# Patient Record
Sex: Female | Born: 1935 | Race: Black or African American | Hispanic: No | Smoking: Former smoker
Health system: Southern US, Community
[De-identification: ages and names within clinical notes are randomized; demographics above are authoritative.]

## PROBLEM LIST (undated history)

## (undated) DIAGNOSIS — I10 Essential (primary) hypertension: Secondary | ICD-10-CM

## (undated) DIAGNOSIS — S065X9A Traumatic subdural hemorrhage with loss of consciousness of unspecified duration, initial encounter: Secondary | ICD-10-CM

## (undated) DIAGNOSIS — K219 Gastro-esophageal reflux disease without esophagitis: Secondary | ICD-10-CM

## (undated) DIAGNOSIS — S065XAA Traumatic subdural hemorrhage with loss of consciousness status unknown, initial encounter: Secondary | ICD-10-CM

## (undated) DIAGNOSIS — M1990 Unspecified osteoarthritis, unspecified site: Secondary | ICD-10-CM

## (undated) DIAGNOSIS — E785 Hyperlipidemia, unspecified: Secondary | ICD-10-CM

## (undated) DIAGNOSIS — E039 Hypothyroidism, unspecified: Secondary | ICD-10-CM

## (undated) DIAGNOSIS — C801 Malignant (primary) neoplasm, unspecified: Secondary | ICD-10-CM

## (undated) HISTORY — PX: BREAST SURGERY: SHX581

## (undated) HISTORY — PX: COLONOSCOPY: SHX174

## (undated) HISTORY — PX: TOTAL THYROIDECTOMY: SHX2547

## (undated) HISTORY — PX: TOOTH EXTRACTION: SUR596

---

## 2003-03-29 ENCOUNTER — Ambulatory Visit (HOSPITAL_COMMUNITY): Admission: RE | Admit: 2003-03-29 | Discharge: 2003-03-29 | Payer: Self-pay | Admitting: Gastroenterology

## 2013-06-14 ENCOUNTER — Other Ambulatory Visit: Payer: Self-pay | Admitting: Gastroenterology

## 2013-06-14 DIAGNOSIS — R109 Unspecified abdominal pain: Secondary | ICD-10-CM

## 2013-06-21 ENCOUNTER — Inpatient Hospital Stay
Admission: RE | Admit: 2013-06-21 | Discharge: 2013-06-21 | Disposition: A | Discharge status: Home / Self Care | Payer: Self-pay | Source: Ambulatory Visit | Attending: Gastroenterology | Admitting: Gastroenterology

## 2013-09-13 ENCOUNTER — Other Ambulatory Visit: Payer: Self-pay | Admitting: Obstetrics and Gynecology

## 2013-09-13 ENCOUNTER — Encounter (HOSPITAL_COMMUNITY): Payer: Self-pay | Admitting: Pharmacist

## 2013-09-13 NOTE — Patient Instructions (Addendum)
   Your procedure is scheduled on:  Wednesday, Feb 4  Enter through the [employer] [facility] at: 11 AM Pick up the phone at the desk and dial [phone] and inform us of your arrival.  Please call this number if you have any problems the morning of surgery: [phone]  Remember: Do not eat food after midnight: Tuesday Do not drink clear liquids after: 8 am Wednesday, day of surgery Take these medicines the morning of surgery with a SIP OF WATER:  Aciphex, hctz, synthroid, lisinopril   Do not wear jewelry, make-up, or FINGER nail polish No metal in your hair or on your body. Do not wear lotions, powders, perfumes. You may wear deodorant.  Do not bring valuables to the hospital. Contacts, dentures or bridgework may not be worn into surgery.  Patients discharged on the day of surgery will not be allowed to drive home.  Home with daughter Rebecca Travis cell [phone].

## 2013-09-14 ENCOUNTER — Encounter (HOSPITAL_COMMUNITY): Payer: Self-pay

## 2013-09-14 ENCOUNTER — Encounter (HOSPITAL_COMMUNITY)
Admission: RE | Admit: 2013-09-14 | Discharge: 2013-09-14 | Disposition: A | Discharge status: Home / Self Care | Payer: Medicare Other | Source: Ambulatory Visit | Attending: Obstetrics and Gynecology | Admitting: Obstetrics and Gynecology

## 2013-09-14 HISTORY — DX: Hyperlipidemia, unspecified: E78.5

## 2013-09-14 HISTORY — DX: Unspecified osteoarthritis, unspecified site: M19.90

## 2013-09-14 HISTORY — DX: Hypothyroidism, unspecified: E03.9

## 2013-09-14 HISTORY — DX: Malignant (primary) neoplasm, unspecified: C80.1

## 2013-09-14 HISTORY — DX: Essential (primary) hypertension: I10

## 2013-09-14 HISTORY — DX: Gastro-esophageal reflux disease without esophagitis: K21.9

## 2013-09-14 LAB — CBC
HCT: 38.5 % (ref 36.0–46.0)
Hemoglobin: 12.9 g/dL (ref 12.0–15.0)
MCH: 29.2 pg (ref 26.0–34.0)
MCHC: 33.5 g/dL (ref 30.0–36.0)
MCV: 87.1 fL (ref 78.0–100.0)
Platelets: 252 10*3/uL (ref 150–400)
RBC: 4.42 MIL/uL (ref 3.87–5.11)
RDW: 13.8 % (ref 11.5–15.5)
WBC: 6.2 10*3/uL (ref 4.0–10.5)

## 2013-09-14 LAB — BASIC METABOLIC PANEL
BUN: 16 mg/dL (ref 6–23)
CALCIUM: 9.6 mg/dL (ref 8.4–10.5)
CO2: 31 mEq/L (ref 19–32)
Chloride: 103 mEq/L (ref 96–112)
Creatinine, Ser: 0.81 mg/dL (ref 0.50–1.10)
GFR calc Af Amer: 74 mL/min — ABNORMAL LOW (ref >90)
GFR, EST NON AFRICAN AMERICAN: 64 mL/min — AB (ref >90)
GLUCOSE: 90 mg/dL (ref 70–99)
POTASSIUM: 4.1 meq/L (ref 3.7–5.3)
Sodium: 144 mEq/L (ref 137–147)

## 2013-09-15 ENCOUNTER — Ambulatory Visit (HOSPITAL_COMMUNITY): Payer: Medicare Other | Admitting: Anesthesiology

## 2013-09-15 ENCOUNTER — Encounter (HOSPITAL_COMMUNITY)
Admission: RE | Disposition: A | Discharge status: Home / Self Care | Payer: Self-pay | Source: Ambulatory Visit | Attending: Obstetrics and Gynecology

## 2013-09-15 ENCOUNTER — Encounter (HOSPITAL_COMMUNITY): Payer: Self-pay | Admitting: Obstetrics and Gynecology

## 2013-09-15 ENCOUNTER — Encounter (HOSPITAL_COMMUNITY): Payer: Medicare Other | Admitting: Anesthesiology

## 2013-09-15 ENCOUNTER — Ambulatory Visit (HOSPITAL_COMMUNITY)
Admission: RE | Admit: 2013-09-15 | Discharge: 2013-09-15 | Disposition: A | Discharge status: Home / Self Care | Payer: Medicare Other | Source: Ambulatory Visit | Attending: Obstetrics and Gynecology | Admitting: Obstetrics and Gynecology

## 2013-09-15 DIAGNOSIS — Z87891 Personal history of nicotine dependence: Secondary | ICD-10-CM | POA: Insufficient documentation

## 2013-09-15 DIAGNOSIS — I10 Essential (primary) hypertension: Secondary | ICD-10-CM | POA: Insufficient documentation

## 2013-09-15 DIAGNOSIS — K219 Gastro-esophageal reflux disease without esophagitis: Secondary | ICD-10-CM | POA: Insufficient documentation

## 2013-09-15 DIAGNOSIS — N840 Polyp of corpus uteri: Secondary | ICD-10-CM | POA: Insufficient documentation

## 2013-09-15 DIAGNOSIS — N9489 Other specified conditions associated with female genital organs and menstrual cycle: Secondary | ICD-10-CM | POA: Diagnosis present

## 2013-09-15 DIAGNOSIS — E039 Hypothyroidism, unspecified: Secondary | ICD-10-CM | POA: Insufficient documentation

## 2013-09-15 HISTORY — PX: HYSTEROSCOPY W/D&C: SHX1775

## 2013-09-15 SURGERY — DILATATION AND CURETTAGE /HYSTEROSCOPY
Anesthesia: General | Site: Vagina

## 2013-09-15 MED ORDER — SUCCINYLCHOLINE CHLORIDE 20 MG/ML IJ SOLN
INTRAMUSCULAR | Status: DC | PRN
  Administered 2013-09-15: 100 mg via INTRAVENOUS

## 2013-09-15 MED ORDER — LIDOCAINE HCL (CARDIAC) 20 MG/ML IV SOLN
INTRAVENOUS | Status: AC
  Filled 2013-09-15: qty 5

## 2013-09-15 MED ORDER — LIDOCAINE HCL 2 % IJ SOLN
INTRAMUSCULAR | Status: DC | PRN
  Administered 2013-09-15: 10 mL

## 2013-09-15 MED ORDER — KETOROLAC TROMETHAMINE 30 MG/ML IJ SOLN: 15.0000 mg | Freq: Once | INTRAMUSCULAR | Status: DC | PRN

## 2013-09-15 MED ORDER — LIDOCAINE HCL (CARDIAC) 20 MG/ML IV SOLN
INTRAVENOUS | Status: DC | PRN
  Administered 2013-09-15: 40 mg via INTRAVENOUS

## 2013-09-15 MED ORDER — DEXAMETHASONE SODIUM PHOSPHATE 10 MG/ML IJ SOLN
INTRAMUSCULAR | Status: AC
  Filled 2013-09-15: qty 1

## 2013-09-15 MED ORDER — GLYCOPYRROLATE 0.2 MG/ML IJ SOLN
INTRAMUSCULAR | Status: AC
  Filled 2013-09-15: qty 1

## 2013-09-15 MED ORDER — PHENYLEPHRINE 40 MCG/ML (10ML) SYRINGE FOR IV PUSH (FOR BLOOD PRESSURE SUPPORT)
PREFILLED_SYRINGE | INTRAVENOUS | Status: AC
  Filled 2013-09-15: qty 5

## 2013-09-15 MED ORDER — MIDAZOLAM HCL 2 MG/2ML IJ SOLN: 0.5000 mg | Freq: Once | INTRAMUSCULAR | Status: DC | PRN

## 2013-09-15 MED ORDER — GLYCINE 1.5 % IR SOLN
Status: DC | PRN
  Administered 2013-09-15: 3000 mL

## 2013-09-15 MED ORDER — FENTANYL CITRATE 0.05 MG/ML IJ SOLN: 25.0000 ug | INTRAMUSCULAR | Status: DC | PRN

## 2013-09-15 MED ORDER — LIDOCAINE HCL 2 % IJ SOLN
INTRAMUSCULAR | Status: AC
  Filled 2013-09-15: qty 20

## 2013-09-15 MED ORDER — MEPERIDINE HCL 25 MG/ML IJ SOLN: 6.2500 mg | INTRAMUSCULAR | Status: DC | PRN

## 2013-09-15 MED ORDER — KETOROLAC TROMETHAMINE 30 MG/ML IJ SOLN
INTRAMUSCULAR | Status: DC | PRN
  Administered 2013-09-15: 15 mg via INTRAVENOUS

## 2013-09-15 MED ORDER — DEXAMETHASONE SODIUM PHOSPHATE 10 MG/ML IJ SOLN
INTRAMUSCULAR | Status: DC | PRN
  Administered 2013-09-15: 4 mg via INTRAVENOUS

## 2013-09-15 MED ORDER — PROMETHAZINE HCL 25 MG/ML IJ SOLN: 6.2500 mg | INTRAMUSCULAR | Status: DC | PRN

## 2013-09-15 MED ORDER — FENTANYL CITRATE 0.05 MG/ML IJ SOLN
INTRAMUSCULAR | Status: AC
  Filled 2013-09-15: qty 5

## 2013-09-15 MED ORDER — PROPOFOL 10 MG/ML IV BOLUS
INTRAVENOUS | Status: DC | PRN
  Administered 2013-09-15: 120 mg via INTRAVENOUS
  Administered 2013-09-15: 60 mg via INTRAVENOUS
  Administered 2013-09-15: 40 mg via INTRAVENOUS

## 2013-09-15 MED ORDER — LACTATED RINGERS IV SOLN: INTRAVENOUS | Status: DC | PRN

## 2013-09-15 MED ORDER — ONDANSETRON HCL 4 MG/2ML IJ SOLN
INTRAMUSCULAR | Status: DC | PRN
  Administered 2013-09-15: 4 mg via INTRAVENOUS

## 2013-09-15 MED ORDER — KETOROLAC TROMETHAMINE 30 MG/ML IJ SOLN
INTRAMUSCULAR | Status: AC
  Filled 2013-09-15: qty 1

## 2013-09-15 MED ORDER — ONDANSETRON HCL 4 MG/2ML IJ SOLN
INTRAMUSCULAR | Status: AC
  Filled 2013-09-15: qty 2

## 2013-09-15 MED ORDER — EPHEDRINE 5 MG/ML INJ
INTRAVENOUS | Status: AC
  Filled 2013-09-15: qty 10

## 2013-09-15 MED ORDER — MIDAZOLAM HCL 2 MG/2ML IJ SOLN
INTRAMUSCULAR | Status: AC
  Filled 2013-09-15: qty 2

## 2013-09-15 MED ORDER — LACTATED RINGERS IV SOLN
INTRAVENOUS | Status: DC
  Administered 2013-09-15 (×2): via INTRAVENOUS

## 2013-09-15 MED ORDER — SUCCINYLCHOLINE CHLORIDE 20 MG/ML IJ SOLN: INTRAMUSCULAR | Status: DC | PRN

## 2013-09-15 MED ORDER — IBUPROFEN 600 MG PO TABS: 600.0000 mg | ORAL_TABLET | Freq: Four times a day (QID) | ORAL | Status: DC | PRN

## 2013-09-15 MED ORDER — PROPOFOL 10 MG/ML IV EMUL
INTRAVENOUS | Status: AC
  Filled 2013-09-15: qty 20

## 2013-09-15 MED ORDER — EPHEDRINE SULFATE 50 MG/ML IJ SOLN
INTRAMUSCULAR | Status: DC | PRN
  Administered 2013-09-15 (×2): 5 mg via INTRAVENOUS

## 2013-09-15 MED ORDER — FENTANYL CITRATE 0.05 MG/ML IJ SOLN
INTRAMUSCULAR | Status: DC | PRN
  Administered 2013-09-15 (×2): 50 ug via INTRAVENOUS

## 2013-09-15 SURGICAL SUPPLY — 26 items
BOOTIES KNEE HIGH SLOAN (MISCELLANEOUS) ×6 IMPLANT
CANISTER SUCT 3000ML (MISCELLANEOUS) ×3 IMPLANT
CATH ROBINSON RED A/P 16FR (CATHETERS) ×3 IMPLANT
CLOTH BEACON ORANGE TIMEOUT ST (SAFETY) ×3 IMPLANT
CONTAINER PREFILL 10% NBF 60ML (FORM) ×6 IMPLANT
CORD ACTIVE DISPOSABLE (ELECTRODE) ×2
CORD ELECTRO ACTIVE DISP (ELECTRODE) ×1 IMPLANT
DILATOR CANAL MILEX (MISCELLANEOUS) IMPLANT
DRSG TELFA 3X8 NADH (GAUZE/BANDAGES/DRESSINGS) ×3 IMPLANT
ELECT LOOP GYNE PRO 24FR (CUTTING LOOP) ×3
ELECT REM PT RETURN 9FT ADLT (ELECTROSURGICAL)
ELECT VAPORTRODE GRVD BAR (ELECTRODE) IMPLANT
ELECTRODE LOOP GYNE PRO 24FR (CUTTING LOOP) ×1 IMPLANT
ELECTRODE REM PT RTRN 9FT ADLT (ELECTROSURGICAL) IMPLANT
ELECTRODE ROLLER VERSAPOINT (ELECTRODE) IMPLANT
ELECTRODE RT ANGLE VERSAPOINT (CUTTING LOOP) IMPLANT
ELECTRODE TWIZZLE TIP (MISCELLANEOUS) IMPLANT
GLOVE SURG SS PI 6.5 STRL IVOR (GLOVE) ×6 IMPLANT
GOWN STRL REUS W/TWL LRG LVL3 (GOWN DISPOSABLE) ×6 IMPLANT
LOOP ANGLED CUTTING 22FR (CUTTING LOOP) IMPLANT
PACK HYSTEROSCOPY LF (CUSTOM PROCEDURE TRAY) ×3 IMPLANT
PAD OB MATERNITY 4.3X12.25 (PERSONAL CARE ITEMS) ×3 IMPLANT
SUT VIC AB 2-0 CT1 27 (SUTURE) ×2
SUT VIC AB 2-0 CT1 TAPERPNT 27 (SUTURE) ×1 IMPLANT
TOWEL OR 17X24 6PK STRL BLUE (TOWEL DISPOSABLE) ×6 IMPLANT
WATER STERILE IRR 1000ML POUR (IV SOLUTION) ×3 IMPLANT

## 2013-09-15 NOTE — Discharge Instructions (Signed)

## 2013-09-15 NOTE — Op Note (Signed)
09/15/2013  1:46 PM  PATIENT:  Rebecca Travis  80 y.o. female  PRE-OPERATIVE DIAGNOSIS:  Uterine Polyp  POST-OPERATIVE DIAGNOSIS:  Uterine Polyp  PROCEDURE:  Procedure(s): DILATATION AND CURETTAGE /HYSTEROSCOPY/resection of polyp  SURGEON:  Dacoda Spallone P, MD  ASSISTANTS: none  ANESTHESIA:   general  ESTIMATED BLOOD LOSS: * No blood loss amount entered *   BLOOD ADMINISTERED:none  COMPLICATIONS:none  FINDINGS: The uterus sounded to 7 cm. Single 3cm endometrial polyp.  The remainder of the endometrium was atrophic  FLUID DEFICIT: Less than 100 cc  LOCAL MEDICATIONS USED:  LIDOCAINE  and Amount: 10 ml  SPECIMEN:  Source of Specimen:  Endometrial polyp and endometrial curettings  DISPOSITION OF SPECIMEN:  PATHOLOGY  COUNTS:  YES  DESCRIPTION OF PROCEDURE:the patient was taken to the operating room after appropriate identification and placed on the operating table. After the attainment of adequate general anesthesia she was placed in the lithotomy position. The perineum and vagina were prepped with multiple layers of Betadine. The bladder was emptied with a an in and out catheter. The perineum was draped in sterile field. A gray speculum was placed in the vagina. The cervix was grasped with a single-tooth tenaculum. A paracervical block was achieved with a total of 10 cc of 2% Xylocaine and the 5 and 7:00 positions. The uterus was sounded to 7 cm.  The cervix was then dilated to accommodate the diagnostic hysteroscope. The hysteroscope was used to evaluate all quadrants of the uterus.  The aforementioned endometrial polyp was noted. The operative hysteroscope was then placed. After further dilation of the cervix. It was used to resect the endometrial polyp along the base and the polyp was removed in its entirety through the cervix. The endometrial cavity was then curetted in all 4 quadrants with almost no tissue obtained. All instruments were then removed from the vagina  and the patient was awakened from general anesthesia and taken to the recovery room in satisfactory condition having tolerated the procedure well sponge and instrument counts correct.  PLAN OF CARE: Patient will be discharged home after adequate. Postanesthesia care  PATIENT DISPOSITION:  PACU - hemodynamically stable.   Delay start of Pharmacological VTE agent (>24hrs) due to surgical blood loss or risk of bleeding:  SCD hose were used during the procedure. Early ambulation is anticipated.   Eldred Manges, MD 1:46 PM

## 2013-09-15 NOTE — Anesthesia Preprocedure Evaluation (Signed)
Anesthesia Evaluation  Patient identified by MRN, date of birth, ID band Patient awake    Reviewed: Allergy & Precautions, H&P , Patient's Chart, lab work & pertinent test results, reviewed documented beta blocker date and time   History of Anesthesia Complications Negative for: history of anesthetic complications  Airway Mallampati: II TM Distance: >3 FB Neck ROM: full    Dental   Pulmonary former smoker,  breath sounds clear to auscultation        Cardiovascular Exercise Tolerance: Good hypertension, Rhythm:regular Rate:Normal     Neuro/Psych negative psych ROS   GI/Hepatic GERD-  ,  Endo/Other  Hypothyroidism   Renal/GU      Musculoskeletal   Abdominal   Peds  Hematology   Anesthesia Other Findings   Reproductive/Obstetrics                           Anesthesia Physical Anesthesia Plan  ASA: II  Anesthesia Plan: General LMA   Post-op Pain Management:    Induction:   Airway Management Planned:   Additional Equipment:   Intra-op Plan:   Post-operative Plan:   Informed Consent: I have reviewed the patients History and Physical, chart, labs and discussed the procedure including the risks, benefits and alternatives for the proposed anesthesia with the patient or authorized representative who has indicated his/her understanding and acceptance.   Dental Advisory Given  Plan Discussed with: CRNA, Surgeon and Anesthesiologist  Anesthesia Plan Comments:         Anesthesia Quick Evaluation

## 2013-09-15 NOTE — Transfer of Care (Signed)
Immediate Anesthesia Transfer of Care Note  Patient: Rebecca Travis  Procedure(s) Performed: Procedure(s): DILATATION AND CURETTAGE /HYSTEROSCOPY WITH UTERINE POLYP RESECTION (N/A)  Patient Location: PACU  Anesthesia Type:General  Level of Consciousness: awake, alert , oriented and patient cooperative  Airway & Oxygen Therapy: Patient Spontanous Breathing and Patient connected to nasal cannula oxygen  Post-op Assessment: Report given to PACU RN and Post -op Vital signs reviewed and stable  Post vital signs: Reviewed and stable  Complications: No apparent anesthesia complications

## 2013-09-15 NOTE — H&P (Signed)
Rebecca Travis is an 80 y.o. female presents for evaluation and removal of an endometrial mass noted on sonohysterogram at [facility].  The patient was referred by her GI physician who was doing a workup of reflux symptoms and abdominal bloating.  A CT scan showed fluid in the endometrial cavity.  There were no other significant abdominal findings on that study.    Pertinent Gynecological History:  Previous GYN Procedures: none  Last mammogram: normal Date: 11/2012 Last pap: normal Date: 06/2013 OB History: G3, P3   Menstrual History:  Postmenopausal  Past Medical History  Diagnosis Date  . Hypertension   . Hypothyroidism   . GERD (gastroesophageal reflux disease)   . SVD (spontaneous vaginal delivery)     x 3  . Hyperlipidemia     diet controlled - no meds  . Arthritis     shoulder, knee  - otc med prn  . Cancer     skin lesions removed squamous cell in MD office    Past Surgical History  Procedure Laterality Date  . Total thyroidectomy    . Breast surgery      right breast duct   . Colonoscopy    . Tooth extraction      History reviewed. No pertinent family history.  Social History:  reports that she quit smoking about 62 years ago. Her smoking use included Cigarettes. She smoked 0.15 packs per day. She has never used smokeless tobacco. She reports that she drinks about 3.0 ounces of alcohol per week. She reports that she does not use illicit drugs.  Allergies: No Known Allergies  Prescriptions prior to admission  Medication Sig Dispense Refill  . cycloSPORINE (RESTASIS) 0.05 % ophthalmic emulsion Place 1 drop into both eyes 2 (two) times daily.      . hydrochlorothiazide (HYDRODIURIL) 25 MG tablet Take 25 mg by mouth daily.      . hyoscyamine (LEVSIN SL) 0.125 MG SL tablet Place 0.125 mg under the tongue 3 (three) times daily as needed (for uspet stomach).      Marland Kitchen levothyroxine (SYNTHROID, LEVOTHROID) 88 MCG tablet Take 88 mcg by mouth daily before breakfast.       . lisinopril (PRINIVIL,ZESTRIL) 20 MG tablet Take 20 mg by mouth daily.      . RABEprazole (ACIPHEX) 20 MG tablet Take 20 mg by mouth 2 (two) times daily with a meal.        Review of Systems  Constitutional: Negative.   HENT: Negative.   Eyes: Negative.   Respiratory: Negative.   Cardiovascular: Positive for palpitations.       Occassional palpitations with episodes of dizziness  Gastrointestinal: Positive for heartburn, diarrhea and constipation.       Has had some reflux sx which are now well controlled. Has had abdominal bloating  Genitourinary: Negative.   Musculoskeletal: Negative.   Skin:       Several basal cell lesions excised  Neurological: Positive for dizziness. Negative for speech change, focal weakness, seizures and loss of consciousness.       No further episodes in the last month  Endo/Heme/Allergies: Negative.     Blood pressure 148/75, pulse 63, temperature 98.1 F (36.7 C), temperature source Oral, resp. rate 20, SpO2 100.00%. Physical Exam  Constitutional: She is oriented to person, place, and time. She appears well-developed and well-nourished.  HENT:  Head: Normocephalic and atraumatic.  Eyes: EOM are normal.  Neck: Normal range of motion. Neck supple. No thyromegaly present.  Cardiovascular: Normal rate and  regular rhythm.   Respiratory: Effort normal and breath sounds normal.  GI: Soft. Bowel sounds are normal.  Genitourinary:  Pelvic exam: VULVA: normal appearing vulva with no masses, tenderness or lesions, VAGINA: atrophic, vaginal discharge - clear and minimal, CERVIX: normal appearing cervix without discharge or lesions, lesions absent, no discharge noted, cervical motion tenderness absent, UTERUS: uterus is normal size, shape, consistency and nontender, ADNEXA: normal adnexa in size, nontender and no masses, RECTAL: normal rectal, no masses, exam limited by body habitus.  Musculoskeletal: Normal range of motion.  Neurological: She is alert and  oriented to person, place, and time.  Skin: Skin is warm and dry.  Psychiatric: She has a normal mood and affect.    Results for orders placed during the hospital encounter of 09/14/13 (from the past 24 hour(s))  BASIC METABOLIC PANEL     Status: Abnormal   Collection Time    09/14/13  1:35 PM      Result Value Range   Sodium 144  137 - 147 mEq/L   Potassium 4.1  3.7 - 5.3 mEq/L   Chloride 103  96 - 112 mEq/L   CO2 31  19 - 32 mEq/L   Glucose, Bld 90  70 - 99 mg/dL   BUN 16  6 - 23 mg/dL   Creatinine, Ser 0.81  0.50 - 1.10 mg/dL   Calcium 9.6  8.4 - 10.5 mg/dL   GFR calc non Af Amer 64 (*) >90 mL/min   GFR calc Af Amer 74 (*) >90 mL/min  CBC     Status: None   Collection Time    09/14/13  1:55 PM      Result Value Range   WBC 6.2  4.0 - 10.5 K/uL   RBC 4.42  3.87 - 5.11 MIL/uL   Hemoglobin 12.9  12.0 - 15.0 g/dL   HCT 38.5  36.0 - 46.0 %   MCV 87.1  78.0 - 100.0 fL   MCH 29.2  26.0 - 34.0 pg   MCHC 33.5  30.0 - 36.0 g/dL   RDW 13.8  11.5 - 15.5 %   Platelets 252  150 - 400 K/uL   SONOHYSTEROGRAM: Normal sized uterus.  Ovaries not visualized.  No adnexal masses.  Endometrium almost entirely filled with mass that appears to originate form anterior wall measuring 2.7cm in greatest dimension.  Assessment/Plan: Asymptomatic endometrial mass in post menopausal pt. Hysteroscopy, resection of endometrial lesion and d&c recommended and accepted. risks of surgery reviewed as anesthesia, infection, bleeding and damage to adjacent organs, as well as the specfic risk of uterine perforation.   Jerick Khachatryan P 09/15/2013, 12:22 PM

## 2013-09-15 NOTE — Anesthesia Postprocedure Evaluation (Signed)
  Anesthesia Post Note  Patient: Rebecca Travis  Procedure(s) Performed: Procedure(s) (LRB): DILATATION AND CURETTAGE /HYSTEROSCOPY WITH UTERINE POLYP RESECTION (N/A)  Anesthesia type: GA  Patient location: PACU  Post pain: Pain level controlled  Post assessment: Post-op Vital signs reviewed  Last Vitals:  Filed Vitals:   09/15/13 1415  BP:   Pulse: 78  Temp:   Resp: 16    Post vital signs: Reviewed  Level of consciousness: sedated  Complications: No apparent anesthesia complications

## 2013-09-17 ENCOUNTER — Encounter (HOSPITAL_COMMUNITY): Payer: Self-pay | Admitting: Obstetrics and Gynecology

## 2015-03-25 ENCOUNTER — Inpatient Hospital Stay (HOSPITAL_COMMUNITY): Payer: Medicare Other | Admitting: Certified Registered Nurse Anesthetist

## 2015-03-25 ENCOUNTER — Encounter (HOSPITAL_COMMUNITY): Payer: Self-pay | Admitting: Emergency Medicine

## 2015-03-25 ENCOUNTER — Inpatient Hospital Stay (HOSPITAL_COMMUNITY)
Admission: EM | Admit: 2015-03-25 | Discharge: 2015-03-29 | DRG: 027 | Disposition: A | Discharge status: Home Health | Payer: Medicare Other | Attending: Neurosurgery | Admitting: Neurosurgery

## 2015-03-25 ENCOUNTER — Emergency Department (HOSPITAL_COMMUNITY): Payer: Medicare Other

## 2015-03-25 ENCOUNTER — Encounter (HOSPITAL_COMMUNITY)
Admission: EM | Disposition: A | Discharge status: Home Health | Payer: Self-pay | Source: Home / Self Care | Attending: Neurosurgery

## 2015-03-25 DIAGNOSIS — E039 Hypothyroidism, unspecified: Secondary | ICD-10-CM | POA: Diagnosis present

## 2015-03-25 DIAGNOSIS — I10 Essential (primary) hypertension: Secondary | ICD-10-CM | POA: Diagnosis present

## 2015-03-25 DIAGNOSIS — Z79899 Other long term (current) drug therapy: Secondary | ICD-10-CM | POA: Diagnosis not present

## 2015-03-25 DIAGNOSIS — Z888 Allergy status to other drugs, medicaments and biological substances status: Secondary | ICD-10-CM | POA: Diagnosis not present

## 2015-03-25 DIAGNOSIS — E785 Hyperlipidemia, unspecified: Secondary | ICD-10-CM | POA: Diagnosis present

## 2015-03-25 DIAGNOSIS — S065XAA Traumatic subdural hemorrhage with loss of consciousness status unknown, initial encounter: Secondary | ICD-10-CM | POA: Diagnosis present

## 2015-03-25 DIAGNOSIS — Z87891 Personal history of nicotine dependence: Secondary | ICD-10-CM

## 2015-03-25 DIAGNOSIS — W01198A Fall on same level from slipping, tripping and stumbling with subsequent striking against other object, initial encounter: Secondary | ICD-10-CM | POA: Diagnosis present

## 2015-03-25 DIAGNOSIS — I6200 Nontraumatic subdural hemorrhage, unspecified: Secondary | ICD-10-CM | POA: Diagnosis present

## 2015-03-25 DIAGNOSIS — S065X0A Traumatic subdural hemorrhage without loss of consciousness, initial encounter: Principal | ICD-10-CM | POA: Diagnosis present

## 2015-03-25 DIAGNOSIS — K219 Gastro-esophageal reflux disease without esophagitis: Secondary | ICD-10-CM | POA: Diagnosis present

## 2015-03-25 DIAGNOSIS — S065X9A Traumatic subdural hemorrhage with loss of consciousness of unspecified duration, initial encounter: Secondary | ICD-10-CM | POA: Diagnosis present

## 2015-03-25 HISTORY — PX: CRANIOTOMY: SHX93

## 2015-03-25 LAB — COMPREHENSIVE METABOLIC PANEL
ALK PHOS: 120 U/L (ref 38–126)
ALT: 10 U/L — ABNORMAL LOW (ref 14–54)
ANION GAP: 9 (ref 5–15)
AST: 17 U/L (ref 15–41)
Albumin: 4.3 g/dL (ref 3.5–5.0)
BILIRUBIN TOTAL: 0.7 mg/dL (ref 0.3–1.2)
BUN: 15 mg/dL (ref 6–20)
CO2: 29 mmol/L (ref 22–32)
CREATININE: 0.93 mg/dL (ref 0.44–1.00)
Calcium: 9.8 mg/dL (ref 8.9–10.3)
Chloride: 104 mmol/L (ref 101–111)
GFR calc non Af Amer: 53 mL/min — ABNORMAL LOW (ref >60)
GLUCOSE: 121 mg/dL — AB (ref 65–99)
Potassium: 3.3 mmol/L — ABNORMAL LOW (ref 3.5–5.1)
Sodium: 142 mmol/L (ref 135–145)
TOTAL PROTEIN: 7.6 g/dL (ref 6.5–8.1)

## 2015-03-25 LAB — URINALYSIS, ROUTINE W REFLEX MICROSCOPIC
Bilirubin Urine: NEGATIVE
Glucose, UA: NEGATIVE mg/dL
Hgb urine dipstick: NEGATIVE
KETONES UR: NEGATIVE mg/dL
Leukocytes, UA: NEGATIVE
Nitrite: NEGATIVE
PH: 6.5 (ref 5.0–8.0)
Protein, ur: NEGATIVE mg/dL
Specific Gravity, Urine: 1.012 (ref 1.005–1.030)
Urobilinogen, UA: 0.2 mg/dL (ref 0.0–1.0)

## 2015-03-25 LAB — TYPE AND SCREEN
ABO/RH(D): B POS
Antibody Screen: NEGATIVE

## 2015-03-25 LAB — CBC WITH DIFFERENTIAL/PLATELET
BASOS PCT: 0 % (ref 0–1)
Basophils Absolute: 0 10*3/uL (ref 0.0–0.1)
EOS ABS: 0 10*3/uL (ref 0.0–0.7)
Eosinophils Relative: 0 % (ref 0–5)
HEMATOCRIT: 41.4 % (ref 36.0–46.0)
Hemoglobin: 14 g/dL (ref 12.0–15.0)
LYMPHS PCT: 15 % (ref 12–46)
Lymphs Abs: 1.1 10*3/uL (ref 0.7–4.0)
MCH: 30.2 pg (ref 26.0–34.0)
MCHC: 33.8 g/dL (ref 30.0–36.0)
MCV: 89.4 fL (ref 78.0–100.0)
Monocytes Absolute: 0.4 10*3/uL (ref 0.1–1.0)
Monocytes Relative: 5 % (ref 3–12)
Neutro Abs: 5.5 10*3/uL (ref 1.7–7.7)
Neutrophils Relative %: 80 % — ABNORMAL HIGH (ref 43–77)
Platelets: 249 10*3/uL (ref 150–400)
RBC: 4.63 MIL/uL (ref 3.87–5.11)
RDW: 14.4 % (ref 11.5–15.5)
WBC: 7 10*3/uL (ref 4.0–10.5)

## 2015-03-25 LAB — PROTIME-INR
INR: 1.02 (ref 0.00–1.49)
PROTHROMBIN TIME: 13.6 s (ref 11.6–15.2)

## 2015-03-25 LAB — ABO/RH: ABO/RH(D): B POS

## 2015-03-25 LAB — APTT: aPTT: 26 seconds (ref 24–37)

## 2015-03-25 LAB — TROPONIN I: Troponin I: <0.03 ng/mL (ref <0.031)

## 2015-03-25 SURGERY — CRANIOTOMY HEMATOMA EVACUATION SUBDURAL
Anesthesia: General | Site: Head | Laterality: Left

## 2015-03-25 MED ORDER — ESMOLOL HCL 10 MG/ML IV SOLN
INTRAVENOUS | Status: DC | PRN
  Administered 2015-03-25 (×2): 20 mg via INTRAVENOUS

## 2015-03-25 MED ORDER — EPHEDRINE SULFATE 50 MG/ML IJ SOLN
INTRAMUSCULAR | Status: DC | PRN
  Administered 2015-03-25: 5 mg via INTRAVENOUS
  Administered 2015-03-25: 80 mg via INTRAVENOUS

## 2015-03-25 MED ORDER — SENNA 8.6 MG PO TABS
1.0000 | ORAL_TABLET | Freq: Two times a day (BID) | ORAL | Status: DC
  Administered 2015-03-26 – 2015-03-29 (×5): 8.6 mg via ORAL
  Filled 2015-03-25 (×9): qty 1

## 2015-03-25 MED ORDER — ONDANSETRON HCL 4 MG/2ML IJ SOLN: 4.0000 mg | Freq: Once | INTRAMUSCULAR | Status: DC | PRN

## 2015-03-25 MED ORDER — ROCURONIUM BROMIDE 100 MG/10ML IV SOLN
INTRAVENOUS | Status: DC | PRN
  Administered 2015-03-25: 10 mg via INTRAVENOUS
  Administered 2015-03-25: 40 mg via INTRAVENOUS

## 2015-03-25 MED ORDER — FENTANYL CITRATE (PF) 250 MCG/5ML IJ SOLN
INTRAMUSCULAR | Status: AC
  Filled 2015-03-25: qty 5

## 2015-03-25 MED ORDER — CEFAZOLIN SODIUM 1-5 GM-% IV SOLN
1.0000 g | Freq: Three times a day (TID) | INTRAVENOUS | Status: AC
  Administered 2015-03-26 (×2): 1 g via INTRAVENOUS
  Filled 2015-03-25 (×2): qty 50

## 2015-03-25 MED ORDER — PHENYLEPHRINE HCL 10 MG/ML IJ SOLN
INTRAMUSCULAR | Status: DC | PRN
  Administered 2015-03-25 (×2): 40 ug via INTRAVENOUS
  Administered 2015-03-25: 20 ug via INTRAVENOUS
  Administered 2015-03-25 (×2): 40 ug via INTRAVENOUS

## 2015-03-25 MED ORDER — FENTANYL CITRATE (PF) 100 MCG/2ML IJ SOLN
INTRAMUSCULAR | Status: AC
  Administered 2015-03-25: 25 ug via INTRAVENOUS
  Filled 2015-03-25: qty 2

## 2015-03-25 MED ORDER — LIDOCAINE-EPINEPHRINE 1 %-1:100000 IJ SOLN
INTRAMUSCULAR | Status: DC | PRN
  Administered 2015-03-25: 3 mL via INTRADERMAL

## 2015-03-25 MED ORDER — ONDANSETRON HCL 4 MG/2ML IJ SOLN
INTRAMUSCULAR | Status: DC | PRN
  Administered 2015-03-25 (×2): 4 mg via INTRAVENOUS

## 2015-03-25 MED ORDER — BUPIVACAINE HCL (PF) 0.5 % IJ SOLN
INTRAMUSCULAR | Status: DC | PRN
  Administered 2015-03-25: 3 mL

## 2015-03-25 MED ORDER — ESMOLOL HCL 10 MG/ML IV SOLN
INTRAVENOUS | Status: AC
  Filled 2015-03-25: qty 10

## 2015-03-25 MED ORDER — PROPOFOL 10 MG/ML IV BOLUS
INTRAVENOUS | Status: DC | PRN
  Administered 2015-03-25: 90 mg via INTRAVENOUS
  Administered 2015-03-25: 10 mg via INTRAVENOUS

## 2015-03-25 MED ORDER — VITAMIN D3 25 MCG (1000 UNIT) PO TABS
1000.0000 [IU] | ORAL_TABLET | Freq: Every day | ORAL | Status: DC
  Administered 2015-03-26 – 2015-03-29 (×4): 1000 [IU] via ORAL
  Filled 2015-03-25 (×4): qty 1

## 2015-03-25 MED ORDER — CALCIUM CARBONATE ANTACID 500 MG PO CHEW
2.0000 | CHEWABLE_TABLET | Freq: Every day | ORAL | Status: DC | PRN
  Filled 2015-03-25: qty 2

## 2015-03-25 MED ORDER — DEXAMETHASONE SODIUM PHOSPHATE 10 MG/ML IJ SOLN
INTRAMUSCULAR | Status: DC | PRN
  Administered 2015-03-25: 5 mg via INTRAVENOUS

## 2015-03-25 MED ORDER — LABETALOL HCL 5 MG/ML IV SOLN
INTRAVENOUS | Status: DC | PRN
  Administered 2015-03-25: 5 mg via INTRAVENOUS

## 2015-03-25 MED ORDER — LISINOPRIL 20 MG PO TABS
20.0000 mg | ORAL_TABLET | Freq: Every day | ORAL | Status: DC
  Administered 2015-03-26 – 2015-03-29 (×4): 20 mg via ORAL
  Filled 2015-03-25 (×4): qty 1

## 2015-03-25 MED ORDER — LABETALOL HCL 5 MG/ML IV SOLN
10.0000 mg | INTRAVENOUS | Status: DC | PRN
  Administered 2015-03-25: 5 mg via INTRAVENOUS

## 2015-03-25 MED ORDER — SODIUM CHLORIDE 0.9 % IV SOLN
INTRAVENOUS | Status: DC
  Administered 2015-03-25 – 2015-03-26 (×2): via INTRAVENOUS
  Administered 2015-03-26: 1000 mL via INTRAVENOUS

## 2015-03-25 MED ORDER — DOCUSATE SODIUM 100 MG PO CAPS
100.0000 mg | ORAL_CAPSULE | Freq: Two times a day (BID) | ORAL | Status: DC
  Administered 2015-03-26 – 2015-03-29 (×6): 100 mg via ORAL
  Filled 2015-03-25 (×9): qty 1

## 2015-03-25 MED ORDER — LABETALOL HCL 5 MG/ML IV SOLN
INTRAVENOUS | Status: AC
  Filled 2015-03-25: qty 4

## 2015-03-25 MED ORDER — GALANTAMINE HYDROBROMIDE 4 MG PO TABS
4.0000 mg | ORAL_TABLET | Freq: Two times a day (BID) | ORAL | Status: DC
  Administered 2015-03-26 – 2015-03-29 (×7): 4 mg via ORAL
  Filled 2015-03-25 (×9): qty 1

## 2015-03-25 MED ORDER — THROMBIN 5000 UNITS EX SOLR
OROMUCOSAL | Status: DC | PRN
  Administered 2015-03-25: 5 mL via TOPICAL

## 2015-03-25 MED ORDER — LIDOCAINE HCL (CARDIAC) 20 MG/ML IV SOLN
INTRAVENOUS | Status: DC | PRN
  Administered 2015-03-25: 20 mg via INTRAVENOUS

## 2015-03-25 MED ORDER — LEVOTHYROXINE SODIUM 75 MCG PO TABS
75.0000 ug | ORAL_TABLET | Freq: Every day | ORAL | Status: DC
  Administered 2015-03-26 – 2015-03-29 (×4): 75 ug via ORAL
  Filled 2015-03-25 (×5): qty 1

## 2015-03-25 MED ORDER — PROMETHAZINE HCL 25 MG PO TABS: 12.5000 mg | ORAL_TABLET | ORAL | Status: DC | PRN

## 2015-03-25 MED ORDER — CYCLOSPORINE 0.05 % OP EMUL
1.0000 [drp] | Freq: Two times a day (BID) | OPHTHALMIC | Status: DC
  Administered 2015-03-26 – 2015-03-29 (×7): 1 [drp] via OPHTHALMIC
  Filled 2015-03-25 (×10): qty 1

## 2015-03-25 MED ORDER — MICROFIBRILLAR COLL HEMOSTAT EX PADS
MEDICATED_PAD | CUTANEOUS | Status: DC | PRN
  Administered 2015-03-25: 1 via TOPICAL

## 2015-03-25 MED ORDER — SODIUM CHLORIDE 0.9 % IV SOLN
INTRAVENOUS | Status: DC | PRN
  Administered 2015-03-25: 16:00:00 via INTRAVENOUS

## 2015-03-25 MED ORDER — 0.9 % SODIUM CHLORIDE (POUR BTL) OPTIME
TOPICAL | Status: DC | PRN
  Administered 2015-03-25 (×2): 1000 mL

## 2015-03-25 MED ORDER — FENTANYL CITRATE (PF) 100 MCG/2ML IJ SOLN
INTRAMUSCULAR | Status: DC | PRN
  Administered 2015-03-25 (×2): 50 ug via INTRAVENOUS
  Administered 2015-03-25: 100 ug via INTRAVENOUS

## 2015-03-25 MED ORDER — BISACODYL 10 MG RE SUPP: 10.0000 mg | Freq: Every day | RECTAL | Status: DC | PRN

## 2015-03-25 MED ORDER — SODIUM CHLORIDE 0.9 % IV SOLN
INTRAVENOUS | Status: DC
  Administered 2015-03-25 (×2): via INTRAVENOUS

## 2015-03-25 MED ORDER — BACITRACIN ZINC 500 UNIT/GM EX OINT
TOPICAL_OINTMENT | CUTANEOUS | Status: DC | PRN
  Administered 2015-03-25: 1 via TOPICAL

## 2015-03-25 MED ORDER — FAMOTIDINE 20 MG PO TABS
20.0000 mg | ORAL_TABLET | Freq: Every day | ORAL | Status: DC
  Administered 2015-03-26 – 2015-03-29 (×4): 20 mg via ORAL
  Filled 2015-03-25 (×4): qty 1

## 2015-03-25 MED ORDER — ONDANSETRON HCL 4 MG PO TABS: 4.0000 mg | ORAL_TABLET | ORAL | Status: DC | PRN

## 2015-03-25 MED ORDER — GLYCOPYRROLATE 0.2 MG/ML IJ SOLN
INTRAMUSCULAR | Status: DC | PRN
Start: 2015-03-25 — End: 2015-03-25
  Administered 2015-03-25: 0.4 mg via INTRAVENOUS

## 2015-03-25 MED ORDER — PHENYLEPHRINE HCL 10 MG/ML IJ SOLN
10.0000 mg | INTRAMUSCULAR | Status: DC | PRN
  Administered 2015-03-25: 10 ug/min via INTRAVENOUS

## 2015-03-25 MED ORDER — NEOSTIGMINE METHYLSULFATE 10 MG/10ML IV SOLN
INTRAVENOUS | Status: DC | PRN
  Administered 2015-03-25: 3 mg via INTRAVENOUS

## 2015-03-25 MED ORDER — THROMBIN 20000 UNITS EX SOLR
CUTANEOUS | Status: DC | PRN
  Administered 2015-03-25: 20 mL via TOPICAL

## 2015-03-25 MED ORDER — HYDROCHLOROTHIAZIDE 25 MG PO TABS
25.0000 mg | ORAL_TABLET | Freq: Every day | ORAL | Status: DC
  Administered 2015-03-26 – 2015-03-29 (×4): 25 mg via ORAL
  Filled 2015-03-25 (×4): qty 1

## 2015-03-25 MED ORDER — CEFAZOLIN SODIUM-DEXTROSE 2-3 GM-% IV SOLR
INTRAVENOUS | Status: AC
  Administered 2015-03-25: 2 g via INTRAVENOUS
  Filled 2015-03-25: qty 50

## 2015-03-25 MED ORDER — LEVETIRACETAM 500 MG/5ML IV SOLN
500.0000 mg | Freq: Two times a day (BID) | INTRAVENOUS | Status: DC
  Administered 2015-03-25 – 2015-03-27 (×4): 500 mg via INTRAVENOUS
  Filled 2015-03-25 (×5): qty 5

## 2015-03-25 MED ORDER — ONDANSETRON HCL 4 MG/2ML IJ SOLN: 4.0000 mg | INTRAMUSCULAR | Status: DC | PRN

## 2015-03-25 MED ORDER — HYDROCODONE-ACETAMINOPHEN 5-325 MG PO TABS: 1.0000 | ORAL_TABLET | ORAL | Status: DC | PRN

## 2015-03-25 MED ORDER — FENTANYL CITRATE (PF) 100 MCG/2ML IJ SOLN
25.0000 ug | INTRAMUSCULAR | Status: DC | PRN
Start: 2015-03-25 — End: 2015-03-26
  Administered 2015-03-25 (×4): 25 ug via INTRAVENOUS
  Filled 2015-03-25: qty 2

## 2015-03-25 SURGICAL SUPPLY — 80 items
BENZOIN TINCTURE PRP APPL 2/3 (GAUZE/BANDAGES/DRESSINGS) IMPLANT
BLADE CLIPPER SURG (BLADE) ×3 IMPLANT
BLADE ULTRA TIP 2M (BLADE) ×3 IMPLANT
BNDG GAUZE ELAST 4 BULKY (GAUZE/BANDAGES/DRESSINGS) IMPLANT
BRUSH SCRUB EZ 1% IODOPHOR (MISCELLANEOUS) ×3 IMPLANT
BUR ACORN 6.0 PRECISION (BURR) ×2 IMPLANT
BUR ACORN 6.0MM PRECISION (BURR) ×1
BUR ADDG 1.1 (BURR) IMPLANT
BUR ADDG 1.1MM (BURR)
BUR MATCHSTICK NEURO 3.0 LAGG (BURR) IMPLANT
BUR SPIRAL ROUTER 2.3 (BUR) ×2 IMPLANT
BUR SPIRAL ROUTER 2.3MM (BUR) ×1
BUR TAPERED ROUTER 3.0 (BURR) ×3 IMPLANT
CANISTER SUCT 3000ML PPV (MISCELLANEOUS) ×3 IMPLANT
CLIP TI MEDIUM 6 (CLIP) IMPLANT
DRAIN SNY WOU 7FLT (WOUND CARE) IMPLANT
DRAPE NEUROLOGICAL W/INCISE (DRAPES) ×3 IMPLANT
DRAPE SURG 17X23 STRL (DRAPES) IMPLANT
DRAPE WARM FLUID 44X44 (DRAPE) ×3 IMPLANT
DRSG TELFA 3X8 NADH (GAUZE/BANDAGES/DRESSINGS) ×3 IMPLANT
DURAMATRIX ONLAY 3X3 (Plate) ×3 IMPLANT
DURAPREP 6ML APPLICATOR 50/CS (WOUND CARE) ×3 IMPLANT
ELECT CAUTERY BLADE 6.4 (BLADE) ×3 IMPLANT
ELECT REM PT RETURN 9FT ADLT (ELECTROSURGICAL) ×3
ELECTRODE REM PT RTRN 9FT ADLT (ELECTROSURGICAL) ×1 IMPLANT
EVACUATOR 1/8 PVC DRAIN (DRAIN) IMPLANT
EVACUATOR SILICONE 100CC (DRAIN) ×3 IMPLANT
GAUZE SPONGE 4X4 12PLY STRL (GAUZE/BANDAGES/DRESSINGS) ×3 IMPLANT
GAUZE SPONGE 4X4 16PLY XRAY LF (GAUZE/BANDAGES/DRESSINGS) IMPLANT
GLOVE BIO SURGEON STRL SZ 6.5 (GLOVE) ×2 IMPLANT
GLOVE BIO SURGEON STRL SZ7 (GLOVE) ×6 IMPLANT
GLOVE BIO SURGEONS STRL SZ 6.5 (GLOVE) ×1
GLOVE BIOGEL PI IND STRL 7.5 (GLOVE) ×1 IMPLANT
GLOVE BIOGEL PI INDICATOR 7.5 (GLOVE) ×2
GLOVE ECLIPSE 7.0 STRL STRAW (GLOVE) ×6 IMPLANT
GLOVE EXAM NITRILE LRG STRL (GLOVE) IMPLANT
GLOVE EXAM NITRILE MD LF STRL (GLOVE) IMPLANT
GLOVE EXAM NITRILE XL STR (GLOVE) IMPLANT
GLOVE EXAM NITRILE XS STR PU (GLOVE) IMPLANT
GOWN STRL REUS W/ TWL LRG LVL3 (GOWN DISPOSABLE) ×2 IMPLANT
GOWN STRL REUS W/ TWL XL LVL3 (GOWN DISPOSABLE) IMPLANT
GOWN STRL REUS W/TWL 2XL LVL3 (GOWN DISPOSABLE) IMPLANT
GOWN STRL REUS W/TWL LRG LVL3 (GOWN DISPOSABLE) ×4
GOWN STRL REUS W/TWL XL LVL3 (GOWN DISPOSABLE)
HEMOSTAT POWDER KIT SURGIFOAM (HEMOSTASIS) ×3 IMPLANT
HEMOSTAT SURGICEL 2X14 (HEMOSTASIS) IMPLANT
KIT BASIN OR (CUSTOM PROCEDURE TRAY) ×3 IMPLANT
KIT ROOM TURNOVER OR (KITS) ×3 IMPLANT
NEEDLE HYPO 25X1 1.5 SAFETY (NEEDLE) ×3 IMPLANT
NS IRRIG 1000ML POUR BTL (IV SOLUTION) ×3 IMPLANT
PACK CRANIOTOMY (CUSTOM PROCEDURE TRAY) ×3 IMPLANT
PATTIES SURGICAL .5 X.5 (GAUZE/BANDAGES/DRESSINGS) IMPLANT
PATTIES SURGICAL .5 X3 (DISPOSABLE) IMPLANT
PATTIES SURGICAL 1X1 (DISPOSABLE) IMPLANT
PLATE 1.5  2HOLE LNG NEURO (Plate) ×4 IMPLANT
PLATE 1.5 2HOLE LNG NEURO (Plate) ×2 IMPLANT
PLATE 1.5/0.5 13MM BURR HOLE (Plate) ×3 IMPLANT
SCREW SELF DRILL HT 1.5/4MM (Screw) ×21 IMPLANT
SPONGE NEURO XRAY DETECT 1X3 (DISPOSABLE) IMPLANT
SPONGE SURGIFOAM ABS GEL 100 (HEMOSTASIS) ×3 IMPLANT
STAPLER VISISTAT 35W (STAPLE) ×3 IMPLANT
STOCKINETTE 6  STRL (DRAPES) ×2
STOCKINETTE 6 STRL (DRAPES) ×1 IMPLANT
SUT ETHILON 3 0 FSL (SUTURE) IMPLANT
SUT ETHILON 3 0 PS 1 (SUTURE) IMPLANT
SUT NURALON 4 0 TR CR/8 (SUTURE) ×9 IMPLANT
SUT PL GUT 3 0 FS 1 (SUTURE) IMPLANT
SUT STEEL 0 (SUTURE)
SUT STEEL 0 18XMFL TIE 17 (SUTURE) IMPLANT
SUT VIC AB 0 CT1 18XCR BRD8 (SUTURE) ×1 IMPLANT
SUT VIC AB 0 CT1 8-18 (SUTURE) ×2
SUT VIC AB 3-0 SH 8-18 (SUTURE) ×6 IMPLANT
SYR CONTROL 10ML LL (SYRINGE) ×6 IMPLANT
TOWEL OR 17X24 6PK STRL BLUE (TOWEL DISPOSABLE) ×3 IMPLANT
TOWEL OR 17X26 10 PK STRL BLUE (TOWEL DISPOSABLE) ×3 IMPLANT
TRAY FOLEY W/METER SILVER 14FR (SET/KITS/TRAYS/PACK) ×3 IMPLANT
TUBE CONNECTING 12'X1/4 (SUCTIONS) ×1
TUBE CONNECTING 12X1/4 (SUCTIONS) ×2 IMPLANT
UNDERPAD 30X30 INCONTINENT (UNDERPADS AND DIAPERS) ×3 IMPLANT
WATER STERILE IRR 1000ML POUR (IV SOLUTION) ×3 IMPLANT

## 2015-03-25 NOTE — Op Note (Signed)
PREOP DIAGNOSIS: Left subacute subdural hematoma  POSTOP DIAGNOSIS: Same  PROCEDURE: 1. Left craniotomy for evacuation of subdural hematoma  SURGEON: Dr. Consuella Lose, MD  ASSISTANT: None  ANESTHESIA: General Endotracheal  EBL: 100cc  SPECIMENS: None  DRAINS: Subdural JP  COMPLICATIONS: None immediate  CONDITION: Hemodynamically stable to PACU  HISTORY: Rebecca Travis is a 81 y.o. female transferred to [facility] after presenting with several weeks of progressive confusion, gait difficulty, and incoordination. CT demonstrated a large left subacute SDH with mass effect. Risks and benefits of surgery were reivewed with the patient and her family. After all questions were answered, consent was obtained.  PROCEDURE IN DETAIL: After informed consent was obtained and witnessed, the patient was brought to the operating room. After induction of general anesthesia, the patient was positioned on the operative table in the supine position. All pressure points were meticulously padded. Skin incision was then marked out and prepped and draped in the usual sterile fashion.  After timeout was conducted, the skin incision was infiltrated with local anesthetic. Skin incision was then made sharply, and Bovie electrocautery was used to dissect the subcutaneous tissue and the galea was incised. Hemostasis was achieved on the skin edges with Raney clips. A single piece cutaneous flap was then elevated and retracted. Bur holes were then created and a craniotomy was fashioned and elevated. Hemostasis was then achieved on the dural surface with bipolar electrocautery. The dura was then opened in cruciate fashion.  The subdural membrane was incised and chronic hematoma was immediately encountered under pressure. After cruciate opening in the subdural membrane, the subdural space was irrigated until fluid ran clear. The subdural space was then inspected and no bleeding was seen.  Good hemostasis was  confirmed on the brain surface. Subdural drain was then placed and tunneled subcutaneously. The dura was then covered with Duramatrix. The bone flap was replaced and secured with titanium plates and screws. The galea was closed using interrupted 3-0 Vicryl sutures. The skin was closed using standard surgical skin staples. Sterile dressing was then applied. The patient was then transferred to the stretcher and taken to the PACU in stable hemodynamic condition.  At the end of the case all sponge, needle, cottonoid, and instrument counts were correct.

## 2015-03-25 NOTE — Anesthesia Preprocedure Evaluation (Signed)
Anesthesia Evaluation  Patient identified by MRN, date of birth, ID band Patient confused    Reviewed: Allergy & Precautions, NPO status , Patient's Chart, lab work & pertinent test results  Airway Mallampati: II  TM Distance: >3 FB Neck ROM: Full    Dental  (+) Teeth Intact, Dental Advisory Given   Pulmonary former smoker,  breath sounds clear to auscultation        Cardiovascular hypertension, Rhythm:Regular Rate:Normal     Neuro/Psych    GI/Hepatic   Endo/Other    Renal/GU      Musculoskeletal   Abdominal   Peds  Hematology   Anesthesia Other Findings   Reproductive/Obstetrics                             Anesthesia Physical Anesthesia Plan  ASA: IV and emergent  Anesthesia Plan: General   Post-op Pain Management:    Induction: Intravenous  Airway Management Planned: Oral ETT  Additional Equipment: Arterial line  Intra-op Plan:   Post-operative Plan: Extubation in OR and Possible Post-op intubation/ventilation  Informed Consent: I have reviewed the patients History and Physical, chart, labs and discussed the procedure including the risks, benefits and alternatives for the proposed anesthesia with the patient or authorized representative who has indicated his/her understanding and acceptance.   Dental advisory given  Plan Discussed with: CRNA and Anesthesiologist  Anesthesia Plan Comments: (81 year female S/P fall 6 weeks ago with worsening mental status now found to have L SDH with 7 mm shift Hypertension  Plan GA with art line  Roberts Gaudy)        Anesthesia Quick Evaluation

## 2015-03-25 NOTE — Anesthesia Postprocedure Evaluation (Signed)
  Anesthesia Post-op Note  Patient: Rebecca Travis  Procedure(s) Performed: Procedure(s): CRANIOTOMY HEMATOMA EVACUATION SUBDURAL (Left)  Patient Location: NICU  Anesthesia Type:General  Level of Consciousness: awake and oriented  Airway and Oxygen Therapy: Patient Spontanous Breathing and Patient connected to nasal cannula oxygen  Post-op Pain: mild  Post-op Assessment: Post-op Vital signs reviewed, Patient's Cardiovascular Status Stable, Respiratory Function Stable, Patent Airway and Pain level controlled              Post-op Vital Signs: stable  Last Vitals:  Filed Vitals:   03/25/15 1915  BP: 163/77  Pulse: 51  Temp: 36.3 C  Resp: 17    Complications: No apparent anesthesia complications

## 2015-03-25 NOTE — Anesthesia Procedure Notes (Signed)
Procedure Name: Intubation Date/Time: 03/25/2015 5:00 PM Performed by: Merdis Delay Pre-anesthesia Checklist: Patient identified, Timeout performed, Emergency Drugs available, Suction available and Patient being monitored Patient Re-evaluated:Patient Re-evaluated prior to inductionOxygen Delivery Method: Circle system utilized Preoxygenation: Pre-oxygenation with 100% oxygen Intubation Type: IV induction Ventilation: Mask ventilation without difficulty and Oral airway inserted - appropriate to patient size Laryngoscope Size: Mac and 3 Grade View: Grade II Tube type: Subglottic suction tube Tube size: 7.5 mm Number of attempts: 1 Placement Confirmation: ETT inserted through vocal cords under direct vision,  breath sounds checked- equal and bilateral,  positive ETCO2 and CO2 detector Secured at: 22 cm Tube secured with: Tape Dental Injury: Teeth and Oropharynx as per pre-operative assessment

## 2015-03-25 NOTE — ED Notes (Signed)
Patient here with daughter who reports increased confusion and increased number of falls over the past couple of weeks. Worried about a neurological problem. Daughter states that patient has not been diagnosed with any neuro problems in the past.

## 2015-03-25 NOTE — Transfer of Care (Signed)
Immediate Anesthesia Transfer of Care Note  Patient: Rebecca Travis  Procedure(s) Performed: Procedure(s): CRANIOTOMY HEMATOMA EVACUATION SUBDURAL (Left)  Patient Location: ICU  Anesthesia Type:General  Level of Consciousness: awake and alert   Airway & Oxygen Therapy: Patient Spontanous Breathing and Patient connected to nasal cannula oxygen  Post-op Assessment: Report given to RN and Post -op Vital signs reviewed and stable  Post vital signs: Reviewed and stable  Last Vitals:  Filed Vitals:   03/25/15 1420  BP: 155/71  Pulse: 91  Temp:   Resp: 16    Complications: No apparent anesthesia complications   Pt moving all extremities. Responding to questions/commands appropriately. VSS. Report given to PACU RN and all questions answered. VSS.

## 2015-03-25 NOTE — H&P (Addendum)
CC:  Chief Complaint  Patient presents with  . Fall  . Altered Mental Status    HPI: Rebecca Travis is a 81 y.o. female who was brought to the ED at [facility] by her family for progressive confusion over the past several weeks. She apparently did suffer a fall about 6 weeks ago, and she thinks struck the left side of her head. She was noted to be getting more and more confused and actually taken to her PCP where the diagnosis of Alzheimer's dementia was entertained. Her daughter also relates progressively worsening ambulation, and worsening coordination and handwriting. Because of these continued symptoms she was taken to the ED today where CT demonstrated a large left subacute/chronic SDH and she was transferred to [facility].  Of note, she is currently denying significant HA, visual changes, or N/T/W. She has no history of antiplatelet or anticoagulant use.  PMH: Past Medical History  Diagnosis Date  . Hypertension   . Hypothyroidism   . GERD (gastroesophageal reflux disease)   . SVD (spontaneous vaginal delivery)     x 3  . Hyperlipidemia     diet controlled - no meds  . Arthritis     shoulder, knee  - otc med prn  . Cancer     PSH: Past Surgical History  Procedure Laterality Date  . Total thyroidectomy    . Breast surgery      right breast duct   . Colonoscopy    . Tooth extraction    . Hysteroscopy w/d&c N/A 09/15/2013    Procedure: DILATATION AND CURETTAGE /HYSTEROSCOPY WITH UTERINE POLYP RESECTION;  Surgeon: Eldred Manges, MD;  Location: [facility];  Service: Gynecology;  Laterality: N/A;    SH: Social History  Substance Use Topics  . Smoking status: Former Smoker -- 0.15 packs/day    Types: Cigarettes    Quit date: 08/13/1951  . Smokeless tobacco: Never Used  . Alcohol Use: 3.0 oz/week    5 Glasses of wine per week    MEDS: Prior to Admission medications   Medication Sig Start Date End Date Taking? Authorizing Provider  calcium carbonate (TUMS) 500 MG chewable  tablet Chew 2 tablets by mouth daily as needed. 11/29/09  Yes Historical Provider, MD  cholecalciferol (VITAMIN D) 1000 UNITS tablet Take 1,000 Units by mouth daily.   Yes Historical Provider, MD  cycloSPORINE (RESTASIS) 0.05 % ophthalmic emulsion Place 1 drop into both eyes 2 (two) times daily.   Yes Historical Provider, MD  galantamine (RAZADYNE) 4 MG tablet Take 4 mg by mouth 2 (two) times daily. 03/24/15 03/23/16 Yes Historical Provider, MD  hydrochlorothiazide (HYDRODIURIL) 25 MG tablet Take 25 mg by mouth daily.   Yes Historical Provider, MD  lisinopril (PRINIVIL,ZESTRIL) 20 MG tablet Take 20 mg by mouth daily.   Yes Historical Provider, MD  ranitidine (ZANTAC) 150 MG tablet Take 150 mg by mouth 2 (two) times daily. 02/06/15 02/06/16 Yes Historical Provider, MD  SYNTHROID 75 MCG tablet Take 75 mcg by mouth daily. 01/30/15  Yes Historical Provider, MD    ALLERGY: Allergies  Allergen Reactions  . Aricept [Donepezil Hcl] Other (See Comments)    nightmares  . Benzonatate Diarrhea  . Cefuroxime Diarrhea  . Donepezil Other (See Comments)    NIGHTMARES  . Fish Oil     Other reaction(s): EXCESSIVE-DIARRHEA  . Gabapentin     Other reaction(s): DROWSINESS  . Loratadine   . Statins     Other reaction(s): MYALGIA    ROS: ROS  NEUROLOGIC EXAM: Awake, alert, somewhat confused, says her age is 36, then 53. Has difficulty with immediate recall - cannot repeat contents of our conversation. Speech fluent, appropriate CN grossly intact Motor exam: Upper Extremities Deltoid Bicep Tricep Grip  Right 5/5 5/5 5/5 5/5  Left 5/5 5/5 5/5 5/5   Lower Extremity IP Quad PF DF EHL  Right 5/5 5/5 5/5 5/5 5/5  Left 5/5 5/5 5/5 5/5 5/5   (+) mild right pronator drift Sensation grossly intact to LT  IMGAING: CTH demonstrates isodense large left convexity SDH with mass effect on the left hemisphere and 42mm L->R MLS. No HCP.  IMPRESSION: - 81 y.o. female with progressive decline in MS due to large  left subacute/chronic SDH with significant mass effect.  PLAN: - Proceed with left crani for evacuation of SDH  I reviewed the CT findings with the patient and my recommendation for surgery. Risks and benefits were reviewed. Her questions were answered. I also spoke at length with the patient's daughter and son-in-law. Treatment options were reviewed, and the reasons for my recommendation of surgical evacuation. Risks including recurrent SDH, SZ, bleeding, infection, stroke resulting in weakness/paralysis/coma were all reviewed. All their questions were answered, and they agreed to proceed with surgery.

## 2015-03-25 NOTE — ED Notes (Signed)
Nurse drawing labs. 

## 2015-03-25 NOTE — ED Notes (Signed)
Pt in CT.

## 2015-03-25 NOTE — ED Provider Notes (Signed)
CSN: [id number]     Arrival date & time 03/25/15  1149 History   First MD Initiated Contact with Patient 03/25/15 1230     Chief Complaint  Patient presents with  . Fall  . Altered Mental Status     (Consider location/radiation/quality/duration/timing/severity/associated sxs/prior Treatment) HPI Comments: Patient here with increased confusion 6 weeks. Had a fall after patient tripped and struck the back of her head. No loss of consciousness with that. Since that time she is in more confused and had trouble doing her daily activities. Family states that prior to this she has some memory problems. Went to see her physician and was diagnosed as possibly having Alzheimer's. No medications were started at that time. Family states that progressively the patient has been more disappointed at times. No new trauma. No reported fever or chills. No vomiting noted. Nothing makes her symptoms better or worse. No new treatments used prior to arrival  Patient is a 81 y.o. female presenting with fall and altered mental status. The history is provided by the patient and a relative.  Fall  Altered Mental Status   Past Medical History  Diagnosis Date  . Hypertension   . Hypothyroidism   . GERD (gastroesophageal reflux disease)   . SVD (spontaneous vaginal delivery)     x 3  . Hyperlipidemia     diet controlled - no meds  . Arthritis     shoulder, knee  - otc med prn  . Cancer    Past Surgical History  Procedure Laterality Date  . Total thyroidectomy    . Breast surgery      right breast duct   . Colonoscopy    . Tooth extraction    . Hysteroscopy w/d&c N/A 09/15/2013    Procedure: DILATATION AND CURETTAGE /HYSTEROSCOPY WITH UTERINE POLYP RESECTION;  Surgeon: Eldred Manges, MD;  Location: [facility];  Service: Gynecology;  Laterality: N/A;   No family history on file. Social History  Substance Use Topics  . Smoking status: Former Smoker -- 0.15 packs/day    Types: Cigarettes    Quit date:  08/13/1951  . Smokeless tobacco: Never Used  . Alcohol Use: 3.0 oz/week    5 Glasses of wine per week   OB History    No data available     Review of Systems  All other systems reviewed and are negative.     Allergies  Aricept; Benzonatate; Cefuroxime; Donepezil; Fish oil; Gabapentin; Loratadine; and Statins  Home Medications   Prior to Admission medications   Medication Sig Start Date End Date Taking? Authorizing Provider  calcium carbonate (TUMS) 500 MG chewable tablet Chew 2 tablets by mouth daily as needed. 11/29/09  Yes Historical Provider, MD  cholecalciferol (VITAMIN D) 1000 UNITS tablet Take 1,000 Units by mouth daily.   Yes Historical Provider, MD  cycloSPORINE (RESTASIS) 0.05 % ophthalmic emulsion Place 1 drop into both eyes 2 (two) times daily.   Yes Historical Provider, MD  galantamine (RAZADYNE) 4 MG tablet Take 4 mg by mouth 2 (two) times daily. 03/24/15 03/23/16 Yes Historical Provider, MD  hydrochlorothiazide (HYDRODIURIL) 25 MG tablet Take 25 mg by mouth daily.   Yes Historical Provider, MD  lisinopril (PRINIVIL,ZESTRIL) 20 MG tablet Take 20 mg by mouth daily.   Yes Historical Provider, MD  ranitidine (ZANTAC) 150 MG tablet Take 150 mg by mouth 2 (two) times daily. 02/06/15 02/06/16 Yes Historical Provider, MD  SYNTHROID 75 MCG tablet Take 75 mcg by mouth daily. 01/30/15  Yes  Historical Provider, MD   BP 164/102 mmHg  Pulse 92  Temp(Src) 98.3 F (36.8 C) (Oral)  Resp 18  SpO2 98% Physical Exam  Constitutional: She is oriented to person, place, and time. She appears well-developed and well-nourished.  Non-toxic appearance. No distress.  HENT:  Head: Normocephalic and atraumatic.  Eyes: Conjunctivae, EOM and lids are normal. Pupils are equal, round, and reactive to light.  Neck: Normal range of motion. Neck supple. No tracheal deviation present. No thyroid mass present.  Cardiovascular: Normal rate, regular rhythm and normal heart sounds.  Exam reveals no gallop.    No murmur heard. Pulmonary/Chest: Effort normal and breath sounds normal. No stridor. No respiratory distress. She has no decreased breath sounds. She has no wheezes. She has no rhonchi. She has no rales.  Abdominal: Soft. Normal appearance and bowel sounds are normal. She exhibits no distension. There is no tenderness. There is no rebound and no CVA tenderness.  Musculoskeletal: Normal range of motion. She exhibits no edema or tenderness.  Neurological: She is alert and oriented to person, place, and time. She displays no tremor. No cranial nerve deficit or sensory deficit. GCS eye subscore is 4. GCS verbal subscore is 5. GCS motor subscore is 6.  Skin: Skin is warm and dry. No abrasion and no rash noted.  Psychiatric: She has a normal mood and affect. Her speech is normal and behavior is normal.  Nursing note and vitals reviewed.   ED Course  Procedures (including critical care time) Labs Review Labs Reviewed  URINE CULTURE  CBC WITH DIFFERENTIAL/PLATELET  COMPREHENSIVE METABOLIC PANEL  URINALYSIS, ROUTINE W REFLEX MICROSCOPIC (NOT AT [facility])  TROPONIN I    Imaging Review No results found. I, Leota Jacobsen, personally reviewed and evaluated these images and lab results as part of my medical decision-making.   EKG Interpretation None     ED ECG REPORT   Date: 03/25/2015  Rate: 59  Rhythm: normal sinus rhythm  QRS Axis: normal  Intervals: normal  ST/T Wave abnormalities: nonspecific T wave changes  Conduction Disutrbances:none  Narrative Interpretation:   Old EKG Reviewed: none available  I have personally reviewed the EKG tracing and agree with the computerized printout as noted. MDM   Final diagnoses:  None    Patient's head CT results noted and reviewed with patient and her daughter. We'll contact on-call neurosurgeon and transferred to [place] for further treatment    Lacretia Leigh, MD 03/25/15 1343

## 2015-03-26 ENCOUNTER — Inpatient Hospital Stay (HOSPITAL_COMMUNITY): Payer: Medicare Other

## 2015-03-26 LAB — URINE CULTURE: Culture: NO GROWTH

## 2015-03-26 LAB — MRSA PCR SCREENING: MRSA by PCR: NEGATIVE

## 2015-03-26 MED ORDER — PNEUMOCOCCAL VAC POLYVALENT 25 MCG/0.5ML IJ INJ
0.5000 mL | INJECTION | INTRAMUSCULAR | Status: AC
  Administered 2015-03-27: 0.5 mL via INTRAMUSCULAR
  Filled 2015-03-26: qty 0.5

## 2015-03-26 MED ORDER — CETYLPYRIDINIUM CHLORIDE 0.05 % MT LIQD
7.0000 mL | Freq: Two times a day (BID) | OROMUCOSAL | Status: DC
  Administered 2015-03-26 (×2): 7 mL via OROMUCOSAL

## 2015-03-26 NOTE — Progress Notes (Addendum)
Pt seen and examined. No issues overnight. Pt has no complaints. No HA, visual changes, or N/T/W  EXAM: Temp:  [97.2 F (36.2 C)-98.3 F (36.8 C)] 97.8 F (36.6 C) (08/14 0359) Pulse Rate:  [45-130] 56 (08/14 0700) Resp:  [11-20] 18 (08/14 0700) BP: (114-171)/(56-102) 115/73 mmHg (08/14 0700) SpO2:  [96 %-100 %] 100 % (08/14 0700) Arterial Line BP: (97-178)/(69-91) 100/82 mmHg (08/14 0300) Weight:  [67 kg (147 lb 11.3 oz)] 67 kg (147 lb 11.3 oz) (08/13 2000) Intake/Output      08/13 0701 - 08/14 0700 08/14 0701 - 08/15 0700   I.V. (mL/kg) 2175 (32.5)    Other 875    IV Piggyback 155    Total Intake(mL/kg) 3205 (47.8)    Urine (mL/kg/hr) 1025    Drains 120    Blood 75    Total Output 1220     Net +1985           Awake, alert CN intact Moving all extremities well, no drift Drain in place  LABS: Lab Results  Component Value Date   CREATININE 0.93 03/25/2015   BUN 15 03/25/2015   NA 142 03/25/2015   K 3.3* 03/25/2015   CL 104 03/25/2015   CO2 29 03/25/2015   Lab Results  Component Value Date   WBC 7.0 03/25/2015   HGB 14.0 03/25/2015   HCT 41.4 03/25/2015   MCV 89.4 03/25/2015   PLT 249 03/25/2015    IMAGING: Repeat CT demonstrates improvement in left cerebral mass effect, improvement in MLS.  IMPRESSION: - 81 y.o. female POD#1 s/p left crani for SDH, doing well  PLAN: - Cont observation in ICU with subdural drain - Can get OOB - Cont Keppra

## 2015-03-27 ENCOUNTER — Encounter (HOSPITAL_COMMUNITY): Payer: Self-pay | Admitting: Neurosurgery

## 2015-03-27 DIAGNOSIS — S065X0A Traumatic subdural hemorrhage without loss of consciousness, initial encounter: Secondary | ICD-10-CM | POA: Diagnosis not present

## 2015-03-27 MED ORDER — LEVETIRACETAM 500 MG PO TABS
500.0000 mg | ORAL_TABLET | Freq: Two times a day (BID) | ORAL | Status: DC
  Administered 2015-03-27 – 2015-03-29 (×4): 500 mg via ORAL
  Filled 2015-03-27 (×5): qty 1

## 2015-03-27 NOTE — Care Management Note (Signed)
Case Management Note  Patient Details  Name: Rebecca Travis MRN: [id number] Date of Birth: 1924/10/20  Subjective/Objective:  Pt admitted on 03/25/15 with [facility], s/p craniotomy.  PTA, pt resides at home with family, and is independent.                    Action/Plan: Will follow for discharge planning as pt progresses.  PT/OT consults pending.    Expected Discharge Date:                  Expected Discharge Plan:  [place]  In-House Referral:     Discharge planning Services  CM Consult  Post Acute Care Choice:    Choice offered to:     DME Arranged:    DME Agency:     HH Arranged:    [place] Agency:     Status of Service:  In process, will continue to follow  Medicare Important Message Given:    Date Medicare IM Given:    Medicare IM give by:    Date Additional Medicare IM Given:    Additional Medicare Important Message give by:     If discussed at [place] of Stay Meetings, dates discussed:    Additional Comments:  Reinaldo Raddle, RN, BSN  Trauma/Neuro ICU Case Manager [phone]

## 2015-03-27 NOTE — Progress Notes (Signed)
Pt seen and examined. No issues overnight. Pt has no HA, or N/T/W. Able to get OOB with minimal assistance.  EXAM: Temp:  [97 F (36.1 C)-98.4 F (36.9 C)] 98.1 F (36.7 C) (08/15 0808) Pulse Rate:  [47-107] 49 (08/15 0800) Resp:  [13-23] 14 (08/15 0800) BP: (92-138)/(55-99) 119/62 mmHg (08/15 0800) SpO2:  [91 %-100 %] 99 % (08/15 0800) Intake/Output      08/14 0701 - 08/15 0700 08/15 0701 - 08/16 0700   P.O. 800    I.V. (mL/kg) 1755 (26.2)    Other     IV Piggyback 260    Total Intake(mL/kg) 2815 (42)    Urine (mL/kg/hr) 1300 (0.8) 400 (3.2)   Drains 32 (0)    Blood     Total Output 1332 400   Net +1483 -400        Urine Occurrence 3 x     Awake, alert CN intact Good strength throughout Drain in place, thin bloody CSF. ~40cc  LABS: Lab Results  Component Value Date   CREATININE 0.93 03/25/2015   BUN 15 03/25/2015   NA 142 03/25/2015   K 3.3* 03/25/2015   CL 104 03/25/2015   CO2 29 03/25/2015   Lab Results  Component Value Date   WBC 7.0 03/25/2015   HGB 14.0 03/25/2015   HCT 41.4 03/25/2015   MCV 89.4 03/25/2015   PLT 249 03/25/2015    IMPRESSION: - 81 y.o. female POD# 2 s/p left crani for evacuation of subacute SDH, doing well  PLAN: - PT/OT eval - Will keep drain one more day, likely d/c tomorrow

## 2015-03-28 NOTE — Progress Notes (Signed)
Pt seen and examined. No issues overnight. Pt denies HA, vision change, N/T/W.   EXAM: Temp:  [97.7 F (36.5 C)-98.1 F (36.7 C)] 97.9 F (36.6 C) (08/16 0748) Pulse Rate:  [38-79] 49 (08/16 0700) Resp:  [13-22] 20 (08/16 0700) BP: (94-155)/(53-130) 134/69 mmHg (08/16 0700) SpO2:  [93 %-100 %] 99 % (08/16 0700) Intake/Output      08/15 0701 - 08/16 0700 08/16 0701 - 08/17 0700   P.O.     I.V. (mL/kg) 240 (3.6)    IV Piggyback     Total Intake(mL/kg) 240 (3.6)    Urine (mL/kg/hr) 2550 (1.6)    Drains 23 (0)    Stool 0 (0)    Total Output 2573     Net -2333          Urine Occurrence 2 x    Stool Occurrence 2 x     Awake, alert, oriented Speech fluent Good strength throughout JP in place, minimal thin output  LABS: Lab Results  Component Value Date   CREATININE 0.93 03/25/2015   BUN 15 03/25/2015   NA 142 03/25/2015   K 3.3* 03/25/2015   CL 104 03/25/2015   CO2 29 03/25/2015   Lab Results  Component Value Date   WBC 7.0 03/25/2015   HGB 14.0 03/25/2015   HCT 41.4 03/25/2015   MCV 89.4 03/25/2015   PLT 249 03/25/2015    IMPRESSION: - 81 y.o. female s/p left crani for SDH evac, doing well  PLAN: - d/c drain - PT/OT eval today - Likely transfer to floor this pm.

## 2015-03-28 NOTE — Evaluation (Signed)
Physical Therapy Evaluation and DISCHARGE Patient Details Name: Rebecca Travis MRN: [id number] DOB: 1924/09/23 Today's Date: 03/28/2015   History of Present Illness  ASMA BOLDON is a 81 y.o. female who was brought to the ED at [facility] by her family for progressive confusion over the past several weeks. She apparently did suffer a fall about 6 weeks ago, and she thinks struck the left side of her head. Found to have a left SDH and now s/p left craniomy for evacuation of SDH.  Clinical Impression  Pt is at or close to baseline functioning and should be safe at home with available assist. There are no further acute PT needs.  Will sign off at this time.     Follow Up Recommendations No PT follow up    Equipment Recommendations  None recommended by PT    Recommendations for Other Services       Precautions / Restrictions Precautions Precautions: Fall Restrictions Weight Bearing Restrictions: No      Mobility  Bed Mobility               General bed mobility comments: Pt up in recliner upon arrival reading book  Transfers Overall transfer level: Independent                  Ambulation/Gait Ambulation/Gait assistance: Independent Ambulation Distance (Feet): 120 Feet Assistive device: None Gait Pattern/deviations: Step-through pattern   Gait velocity interpretation: at or above normal speed for age/gender General Gait Details: steady gait, no needs for AD  Stairs            Wheelchair Mobility    Modified Rankin (Stroke Patients Only)       Balance Overall balance assessment: Needs assistance Sitting-balance support: No upper extremity supported       Standing balance support: No upper extremity supported Standing balance-Leahy Scale: Good               High level balance activites: Backward walking;Direction changes;Turns;Sudden stops;Head turns High Level Balance Comments: no deviation or overt LOB             Pertinent  Vitals/Pain Pain Assessment: No/denies pain    Home Living Family/patient expects to be discharged to:: Private residence Living Arrangements: Alone Available Help at Discharge: Family;Available PRN/intermittently Type of Home: House Home Access: Level entry     Home Layout: One level Home Equipment: Shower seat - built in      Prior Function Level of Independence: Independent               Hand Dominance   Dominant Hand: Right    Extremity/Trunk Assessment   Upper Extremity Assessment: Defer to OT evaluation           Lower Extremity Assessment: Overall WFL for tasks assessed         Communication   Communication: No difficulties  Cognition Arousal/Alertness: Awake/alert Behavior During Therapy: WFL for tasks assessed/performed Overall Cognitive Status: Within Functional Limits for tasks assessed                      General Comments General comments (skin integrity, edema, etc.): VSS    Exercises        Assessment/Plan    PT Assessment Patent does not need any further PT services  PT Diagnosis     PT Problem List    PT Treatment Interventions     PT Goals (Current goals can be found in the Care Plan  section) Acute Rehab PT Goals Patient Stated Goal: to get back home PT Goal Formulation: All assessment and education complete, DC therapy    Frequency     Barriers to discharge        Co-evaluation PT/OT/SLP Co-Evaluation/Treatment: Yes Reason for Co-Treatment: For patient/therapist safety PT goals addressed during session: Mobility/safety with mobility         End of Session   Activity Tolerance: Patient tolerated treatment well Patient left: in chair;with call bell/phone within reach Nurse Communication: Mobility status         Time: 8315-1761 PT Time Calculation (min) (ACUTE ONLY): 10 min   Charges:   PT Evaluation $Initial PT Evaluation Tier I: 1 Procedure     PT G Codes:        Estela Vinal, Tessie Fass 03/28/2015, 4:03 PM 03/28/2015  Donnella Sham, PT [phone]  (pager)

## 2015-03-28 NOTE — Evaluation (Signed)
Occupational Therapy Evaluation and Discharge Patient Details Name: Rebecca Travis MRN: [id number] DOB: 20-Sep-1924 Today's Date: 03/28/2015    History of Present Illness Rebecca Travis is a 81 y.o. female who was brought to the ED at [facility] by her family for progressive confusion over the past several weeks. She apparently did suffer a fall about 6 weeks ago, and she thinks struck the left side of her head. Found to have a left SDH and now s/p left craniomy for evacuation of SDH.   Clinical Impression   This 81 yo female admitted and underwent above presents to acute OT at an independent level for all BADLs. Driving per MD.  We do recommend intermittent S at least initially for IADLs in pt's own environment. Acute OT will sign off.    Follow Up Recommendations  No OT follow up (intermittent S for IADLs at least initially in pt's own environment)    Equipment Recommendations  None recommended by OT       Precautions / Restrictions Precautions Precautions: Fall Restrictions Weight Bearing Restrictions: No      Mobility Bed Mobility               General bed mobility comments: Pt up in recliner upon arrival reading book  Transfers Overall transfer level: Independent                    Balance Overall balance assessment: No apparent balance deficits (not formally assessed)                                          ADL Overall ADL's : Independent                                             Vision Additional Comments: No change from baseline, wears glasses for reading (was ready a book upon enteriing room)          Pertinent Vitals/Pain Pain Assessment: No/denies pain     Hand Dominance Right   Extremity/Trunk Assessment Upper Extremity Assessment Upper Extremity Assessment: Overall WFL for tasks assessed   Lower Extremity Assessment Lower Extremity Assessment: Defer to PT evaluation       Communication  Communication Communication: No difficulties   Cognition Arousal/Alertness: Awake/alert Behavior During Therapy: WFL for tasks assessed/performed Overall Cognitive Status: Within Functional Limits for tasks assessed                                Home Living Family/patient expects to be discharged to:: Private residence Living Arrangements: Alone Available Help at Discharge: Family;Available PRN/intermittently Type of Home: House Home Access: Level entry     Home Layout: One level     Bathroom Shower/Tub: Walk-in shower;Door   [employer]: Standard     Home Equipment: Civil engineer, contracting - built in          Prior Functioning/Environment Level of Independence: Independent             OT Diagnosis: Generalized weakness         OT Goals(Current goals can be found in the care plan section) Acute Rehab OT Goals Patient Stated Goal: to get back home  OT Frequency:  Co-evaluation PT/OT/SLP Co-Evaluation/Treatment: Yes (partial)     OT goals addressed during session: ADL's and self-care;Strengthening/ROM      End of Session Equipment Utilized During Treatment:  (none) Nurse Communication:  (pt good from OT/PT standpoint we are signing off)  Activity Tolerance: Patient tolerated treatment well Patient left: in chair;with call bell/phone within reach;with chair alarm set   Time: 0768-0881 OT Time Calculation (min): 15 min Charges:  OT General Charges $OT Visit: 1 Procedure OT Evaluation $Initial OT Evaluation Tier I: 1 Procedure  Almon Register [id number] 03/28/2015, 10:44 AM

## 2015-03-29 MED ORDER — LEVETIRACETAM 500 MG PO TABS: 500.0000 mg | ORAL_TABLET | Freq: Two times a day (BID) | ORAL | Status: DC

## 2015-03-29 NOTE — Discharge Summary (Signed)
  Physician Discharge Summary  Patient ID: Rebecca Travis MRN: [id number] DOB/AGE: Jun 23, 1925 81 y.o.  Admit date: 03/25/2015 Discharge date: 03/29/2015  Admission Diagnoses: Subdural hematoma  Discharge Diagnoses: Same Active Problems:   Subdural hematoma   Discharged Condition: Stable  Hospital Course:  Rebecca Travis is a 81 y.o. female elecitvely admitted after emergent left craniotomy for sdh. She had an unremarkable hospital course with return to neurologic baseline, with the exception of subtle confusion which was present prior to surgery. She had no HA, was tolerating diet, walking without assistance and was cleared for d/c home by PT and OT.  Treatments: Surgery - left craniotomy for evacuation of SDH  Discharge Exam: Blood pressure 133/70, pulse 67, temperature 98 F (36.7 C), temperature source Oral, resp. rate 16, height 5\' 4"  (1.626 m), weight 67 kg (147 lb 11.3 oz), SpO2 100 %. Awake, alert, oriented Speech fluent, appropriate CN grossly intact 5/5 BUE/BLE Wound c/d/i  Disposition: 01-Home or Self Care     Medication List    TAKE these medications        cholecalciferol 1000 UNITS tablet  Commonly known as:  VITAMIN D  Take 1,000 Units by mouth daily.     cycloSPORINE 0.05 % ophthalmic emulsion  Commonly known as:  RESTASIS  Place 1 drop into both eyes 2 (two) times daily.     galantamine 4 MG tablet  Commonly known as:  RAZADYNE  Take 4 mg by mouth 2 (two) times daily.     hydrochlorothiazide 25 MG tablet  Commonly known as:  HYDRODIURIL  Take 25 mg by mouth daily.     levETIRAcetam 500 MG tablet  Commonly known as:  KEPPRA  Take 1 tablet (500 mg total) by mouth 2 (two) times daily.     lisinopril 20 MG tablet  Commonly known as:  PRINIVIL,ZESTRIL  Take 20 mg by mouth daily.     ranitidine 150 MG tablet  Commonly known as:  ZANTAC  Take 150 mg by mouth 2 (two) times daily.     SYNTHROID 75 MCG tablet  Generic drug:   levothyroxine  Take 75 mcg by mouth daily.     TUMS 500 MG chewable tablet  Generic drug:  calcium carbonate  Chew 2 tablets by mouth daily as needed.           Follow-up Information    Follow up with [facility], Arian Mcquitty, C, MD In 2 weeks.   Specialty:  Neurosurgery   Contact information:   [address] [place] [address] [phone]       Signed: Consuella Lose, Loletha Grayer 03/29/2015, 11:29 AM

## 2015-03-29 NOTE — Plan of Care (Signed)
Problem: Consults Goal: Diagnosis - Craniotomy Outcome: Completed/Met Date Met:  03/29/15 Tumor

## 2015-03-29 NOTE — Progress Notes (Signed)
Discharge instructions explained and given to patient with daughter Joseph Art at bedside. All questions and concerns answered.

## 2015-05-20 ENCOUNTER — Encounter (HOSPITAL_BASED_OUTPATIENT_CLINIC_OR_DEPARTMENT_OTHER): Payer: Self-pay | Admitting: Emergency Medicine

## 2015-05-20 ENCOUNTER — Emergency Department (HOSPITAL_BASED_OUTPATIENT_CLINIC_OR_DEPARTMENT_OTHER)
Admission: EM | Admit: 2015-05-20 | Discharge: 2015-05-21 | Disposition: A | Discharge status: Home / Self Care | Payer: Medicare Other | Attending: Emergency Medicine | Admitting: Emergency Medicine

## 2015-05-20 DIAGNOSIS — Z8739 Personal history of other diseases of the musculoskeletal system and connective tissue: Secondary | ICD-10-CM | POA: Diagnosis not present

## 2015-05-20 DIAGNOSIS — I10 Essential (primary) hypertension: Secondary | ICD-10-CM | POA: Insufficient documentation

## 2015-05-20 DIAGNOSIS — Z87891 Personal history of nicotine dependence: Secondary | ICD-10-CM | POA: Diagnosis not present

## 2015-05-20 DIAGNOSIS — K219 Gastro-esophageal reflux disease without esophagitis: Secondary | ICD-10-CM | POA: Insufficient documentation

## 2015-05-20 DIAGNOSIS — Z79899 Other long term (current) drug therapy: Secondary | ICD-10-CM | POA: Diagnosis not present

## 2015-05-20 DIAGNOSIS — E039 Hypothyroidism, unspecified: Secondary | ICD-10-CM | POA: Insufficient documentation

## 2015-05-20 DIAGNOSIS — Z859 Personal history of malignant neoplasm, unspecified: Secondary | ICD-10-CM | POA: Diagnosis not present

## 2015-05-20 NOTE — ED Notes (Signed)
Patient stated her BP was elevated at home. Stated she recently stopped taking BP meds due to low BP. Daughter gave Lisinopril 20mg  at home before coming to ED. Denies HA, blurred vision, or trouble seeing. Stated home BP measured at 184/107,

## 2015-05-21 DIAGNOSIS — I10 Essential (primary) hypertension: Secondary | ICD-10-CM | POA: Diagnosis not present

## 2015-05-21 LAB — CBC WITH DIFFERENTIAL/PLATELET
BASOS ABS: 0 10*3/uL (ref 0.0–0.1)
Basophils Relative: 0 %
Eosinophils Absolute: 0.1 10*3/uL (ref 0.0–0.7)
Eosinophils Relative: 2 %
HEMATOCRIT: 37.4 % (ref 36.0–46.0)
HEMOGLOBIN: 12.3 g/dL (ref 12.0–15.0)
LYMPHS ABS: 2.1 10*3/uL (ref 0.7–4.0)
LYMPHS PCT: 36 %
MCH: 29.6 pg (ref 26.0–34.0)
MCHC: 32.9 g/dL (ref 30.0–36.0)
MCV: 89.9 fL (ref 78.0–100.0)
Monocytes Absolute: 0.5 10*3/uL (ref 0.1–1.0)
Monocytes Relative: 9 %
NEUTROS ABS: 3 10*3/uL (ref 1.7–7.7)
Neutrophils Relative %: 53 %
Platelets: 254 10*3/uL (ref 150–400)
RBC: 4.16 MIL/uL (ref 3.87–5.11)
RDW: 13.5 % (ref 11.5–15.5)
WBC: 5.7 10*3/uL (ref 4.0–10.5)

## 2015-05-21 LAB — COMPREHENSIVE METABOLIC PANEL
ALBUMIN: 3.8 g/dL (ref 3.5–5.0)
ALT: 10 U/L — ABNORMAL LOW (ref 14–54)
ANION GAP: 5 (ref 5–15)
AST: 12 U/L — ABNORMAL LOW (ref 15–41)
Alkaline Phosphatase: 114 U/L (ref 38–126)
BILIRUBIN TOTAL: 0.4 mg/dL (ref 0.3–1.2)
BUN: 13 mg/dL (ref 6–20)
CHLORIDE: 108 mmol/L (ref 101–111)
CO2: 28 mmol/L (ref 22–32)
Calcium: 9.2 mg/dL (ref 8.9–10.3)
Creatinine, Ser: 0.73 mg/dL (ref 0.44–1.00)
GFR calc Af Amer: >60 mL/min (ref >60)
Glucose, Bld: 106 mg/dL — ABNORMAL HIGH (ref 65–99)
POTASSIUM: 3.6 mmol/L (ref 3.5–5.1)
Sodium: 141 mmol/L (ref 135–145)
TOTAL PROTEIN: 6.8 g/dL (ref 6.5–8.1)

## 2015-05-21 MED ORDER — CLONIDINE HCL 0.1 MG PO TABS: 0.1000 mg | ORAL_TABLET | Freq: Once | ORAL | Status: DC

## 2015-05-21 NOTE — ED Provider Notes (Signed)
CSN: [id number]     Arrival date & time 05/20/15  2346 History  By signing my name below, I, Helane Gunther, attest that this documentation has been prepared under the direction and in the presence of Veryl Speak, MD. Electronically Signed: Helane Gunther, ED Scribe. 05/21/2015. 12:23 AM.    Chief Complaint  Patient presents with  . Hypertension   Patient is a 81 y.o. female presenting with hypertension. The history is provided by the patient and a relative. No language interpreter was used.  Hypertension This is a chronic problem. The current episode started less than 1 hour ago. Pertinent negatives include no abdominal pain. Nothing aggravates the symptoms. Nothing relieves the symptoms.   HPI Comments: TNYA ADES is a 81 y.o. female with a PMHx of HTN and HLD who presents to the Emergency Department complaining of HTN onset 1 our ago. She reports associated tremors and daughter states pt "is not herself". Per daughter pt was taken off of the blood pressure medication by her doctor 3 days ago because her BP had dropped. She notes that the pt's BP spiked tonight (measured at 184/107), which is when she was given 20 mg of lisinopril (1 hour ago) and brought to the ED. Per daughter pt was on a diuretic as well, but notes this was stopped along with the Lisinopril. She notes a PSHx of craniotomy hematoma evacuation on 8/13. Pt denies fever, abdominal pain, and leg swelling.   Past Medical History  Diagnosis Date  . Hypertension   . Hypothyroidism   . GERD (gastroesophageal reflux disease)   . SVD (spontaneous vaginal delivery)     x 3  . Hyperlipidemia     diet controlled - no meds  . Arthritis     shoulder, knee  - otc med prn  . Cancer [facility])    Past Surgical History  Procedure Laterality Date  . Total thyroidectomy    . Breast surgery      right breast duct   . Colonoscopy    . Tooth extraction    . Hysteroscopy w/d&c N/A 09/15/2013    Procedure: DILATATION AND CURETTAGE  /HYSTEROSCOPY WITH UTERINE POLYP RESECTION;  Surgeon: Eldred Manges, MD;  Location: [facility];  Service: Gynecology;  Laterality: N/A;  . Craniotomy Left 03/25/2015    Procedure: CRANIOTOMY HEMATOMA EVACUATION SUBDURAL;  Surgeon: Consuella Lose, MD;  Location: [facility];  Service: Neurosurgery;  Laterality: Left;   No family history on file. Social History  Substance Use Topics  . Smoking status: Former Smoker -- 0.15 packs/day    Types: Cigarettes    Quit date: 08/13/1951  . Smokeless tobacco: Never Used  . Alcohol Use: Yes     Comment: occasionally   OB History    No data available     Review of Systems  Constitutional: Negative for fever.  Cardiovascular: Negative for leg swelling.  Gastrointestinal: Negative for abdominal pain.  Neurological: Positive for tremors.  All other systems reviewed and are negative.   Allergies  Aricept; Benzonatate; Cefuroxime; Donepezil; Fish oil; Gabapentin; Loratadine; and Statins  Home Medications   Prior to Admission medications   Medication Sig Start Date End Date Taking? Authorizing Provider  calcium carbonate (TUMS) 500 MG chewable tablet Chew 2 tablets by mouth daily as needed. 11/29/09  Yes Historical Provider, MD  cholecalciferol (VITAMIN D) 1000 UNITS tablet Take 1,000 Units by mouth daily.   Yes Historical Provider, MD  cycloSPORINE (RESTASIS) 0.05 % ophthalmic emulsion Place 1 drop into  both eyes 2 (two) times daily.   Yes Historical Provider, MD  galantamine (RAZADYNE) 4 MG tablet Take 4 mg by mouth 2 (two) times daily. 03/24/15 03/23/16 Yes Historical Provider, MD  ranitidine (ZANTAC) 150 MG tablet Take 150 mg by mouth 2 (two) times daily. 02/06/15 02/06/16 Yes Historical Provider, MD  SYNTHROID 75 MCG tablet Take 75 mcg by mouth daily. 01/30/15  Yes Historical Provider, MD  hydrochlorothiazide (HYDRODIURIL) 25 MG tablet Take 25 mg by mouth daily.    Historical Provider, MD  levETIRAcetam (KEPPRA) 500 MG tablet Take 1 tablet (500  mg total) by mouth 2 (two) times daily. 03/29/15   Consuella Lose, MD  lisinopril (PRINIVIL,ZESTRIL) 20 MG tablet Take 20 mg by mouth daily.    Historical Provider, MD   BP 196/92 mmHg  Pulse 64  Temp(Src) 97.5 F (36.4 C) (Axillary)  Resp 14  Ht 5\' 4"  (1.626 m)  Wt 140 lb (63.504 kg)  BMI 24.02 kg/m2  SpO2 100% Physical Exam  Constitutional: She is oriented to person, place, and time. She appears well-developed and well-nourished. No distress.  HENT:  Head: Normocephalic and atraumatic.  Eyes: EOM are normal.  Neck: Normal range of motion.  Cardiovascular: Normal rate, regular rhythm and normal heart sounds.   Pulmonary/Chest: Effort normal and breath sounds normal.  Abdominal: Soft. She exhibits no distension. There is no tenderness.  Musculoskeletal: Normal range of motion.  Neurological: She is alert and oriented to person, place, and time.  Skin: Skin is warm and dry.  Psychiatric: She has a normal mood and affect. Judgment normal.  Nursing note and vitals reviewed.   ED Course  Procedures  DIAGNOSTIC STUDIES: Oxygen Saturation is 100% on RA, normal by my interpretation.    COORDINATION OF CARE: 12:09 AM - Discussed plans to order a dose of Clonidine and diagnostic studies. Pt advised of plan for treatment and pt agrees.  Labs Review Labs Reviewed  COMPREHENSIVE METABOLIC PANEL  CBC WITH DIFFERENTIAL/PLATELET    Imaging Review No results found. I have personally reviewed and evaluated these images and lab results as part of my medical decision-making.   EKG Interpretation   Date/Time:  Sunday May 21 2015 00:18:48 EDT Ventricular Rate:  55 PR Interval:  210 QRS Duration: 80 QT Interval:  402 QTC Calculation: 384 R Axis:   15 Text Interpretation:  Sinus bradycardia with sinus arrhythmia with 1st  degree A-V block Otherwise normal ECG Confirmed by Matheson Vandehei  MD, Dalayah Deahl  ([id number]) on 05/21/2015 12:40:51 AM      MDM   Final diagnoses:  None     Patient presents for evaluation of elevated blood pressure. She has no symptoms otherwise. Workup reveals no evidence for endorgan damage. She has no complaints that are concerning and I feel require further workup. She took a dose of lisinopril prior to coming here and her blood pressure is now 145/80. I personally checked this well and was in the room discussing the results of the tests. At this point I feel as though she is appropriate for discharge. To return as needed for any problems.  Allena Napoleon, personally performed the services described in this documentation. All medical record entries made by the scribe were at my direction and in my presence.  I have reviewed the chart and discharge instructions and agree that the record reflects my personal performance and is accurate and complete. Veryl Speak.  05/21/2015. 1:33 AM.       Veryl Speak, MD 05/21/15 [phone]

## 2015-05-21 NOTE — Discharge Instructions (Signed)
Resume taking your lisinopril as previously prescribed.  Keep a record of your blood pressure for the next several days and take this with you to your follow-up doctor's appointment, ideally within the next week.  Return to the emergency department if symptoms significantly worsen or change.   Hypertension Hypertension, commonly called high blood pressure, is when the force of blood pumping through your arteries is too strong. Your arteries are the blood vessels that carry blood from your heart throughout your body. A blood pressure reading consists of a higher number over a lower number, such as 110/72. The higher number (systolic) is the pressure inside your arteries when your heart pumps. The lower number (diastolic) is the pressure inside your arteries when your heart relaxes. Ideally you want your blood pressure below 120/80. Hypertension forces your heart to work harder to pump blood. Your arteries may become narrow or stiff. Having untreated or uncontrolled hypertension can cause heart attack, stroke, kidney disease, and other problems. RISK FACTORS Some risk factors for high blood pressure are controllable. Others are not.  Risk factors you cannot control include:   Race. You may be at higher risk if you are African American.  Age. Risk increases with age.  Gender. Men are at higher risk than women before age 25 years. After age 85, women are at higher risk than men. Risk factors you can control include:  Not getting enough exercise or physical activity.  Being overweight.  Getting too much fat, sugar, calories, or salt in your diet.  Drinking too much alcohol. SIGNS AND SYMPTOMS Hypertension does not usually cause signs or symptoms. Extremely high blood pressure (hypertensive crisis) may cause headache, anxiety, shortness of breath, and nosebleed. DIAGNOSIS To check if you have hypertension, your health care provider will measure your blood pressure while you are seated, with  your arm held at the level of your heart. It should be measured at least twice using the same arm. Certain conditions can cause a difference in blood pressure between your right and left arms. A blood pressure reading that is higher than normal on one occasion does not mean that you need treatment. If it is not clear whether you have high blood pressure, you may be asked to return on a different day to have your blood pressure checked again. Or, you may be asked to monitor your blood pressure at home for 1 or more weeks. TREATMENT Treating high blood pressure includes making lifestyle changes and possibly taking medicine. Living a healthy lifestyle can help lower high blood pressure. You may need to change some of your habits. Lifestyle changes may include:  Following the DASH diet. This diet is high in fruits, vegetables, and whole grains. It is low in salt, red meat, and added sugars.  Keep your sodium intake below 2,300 mg per day.  Getting at least 30-45 minutes of aerobic exercise at least 4 times per week.  Losing weight if necessary.  Not smoking.  Limiting alcoholic beverages.  Learning ways to reduce stress. Your health care provider may prescribe medicine if lifestyle changes are not enough to get your blood pressure under control, and if one of the following is true:  You are 5-52 years of age and your systolic blood pressure is above 140.  You are 59 years of age or older, and your systolic blood pressure is above 150.  Your diastolic blood pressure is above 90.  You have diabetes, and your systolic blood pressure is over 973 or your diastolic  blood pressure is over 90.  You have kidney disease and your blood pressure is above 140/90.  You have heart disease and your blood pressure is above 140/90. Your personal target blood pressure may vary depending on your medical conditions, your age, and other factors. HOME CARE INSTRUCTIONS  Have your blood pressure rechecked as  directed by your health care provider.   Take medicines only as directed by your health care provider. Follow the directions carefully. Blood pressure medicines must be taken as prescribed. The medicine does not work as well when you skip doses. Skipping doses also puts you at risk for problems.  Do not smoke.   Monitor your blood pressure at home as directed by your health care provider. SEEK MEDICAL CARE IF:   You think you are having a reaction to medicines taken.  You have recurrent headaches or feel dizzy.  You have swelling in your ankles.  You have trouble with your vision. SEEK IMMEDIATE MEDICAL CARE IF:  You develop a severe headache or confusion.  You have unusual weakness, numbness, or feel faint.  You have severe chest or abdominal pain.  You vomit repeatedly.  You have trouble breathing. MAKE SURE YOU:   Understand these instructions.  Will watch your condition.  Will get help right away if you are not doing well or get worse.   This information is not intended to replace advice given to you by your health care provider. Make sure you discuss any questions you have with your health care provider.   Document Released: 07/29/2005 Document Revised: 12/13/2014 Document Reviewed: 05/21/2013 Elsevier Interactive Patient Education [employer].

## 2015-07-09 ENCOUNTER — Emergency Department (HOSPITAL_COMMUNITY): Payer: Medicare Other

## 2015-07-09 ENCOUNTER — Emergency Department (HOSPITAL_COMMUNITY)
Admission: EM | Admit: 2015-07-09 | Discharge: 2015-07-09 | Disposition: A | Discharge status: Home / Self Care | Payer: Medicare Other | Attending: Emergency Medicine | Admitting: Emergency Medicine

## 2015-07-09 ENCOUNTER — Encounter (HOSPITAL_COMMUNITY): Payer: Self-pay | Admitting: Nurse Practitioner

## 2015-07-09 DIAGNOSIS — K219 Gastro-esophageal reflux disease without esophagitis: Secondary | ICD-10-CM | POA: Insufficient documentation

## 2015-07-09 DIAGNOSIS — Z859 Personal history of malignant neoplasm, unspecified: Secondary | ICD-10-CM | POA: Diagnosis not present

## 2015-07-09 DIAGNOSIS — Z87891 Personal history of nicotine dependence: Secondary | ICD-10-CM | POA: Insufficient documentation

## 2015-07-09 DIAGNOSIS — R410 Disorientation, unspecified: Secondary | ICD-10-CM

## 2015-07-09 DIAGNOSIS — R5383 Other fatigue: Secondary | ICD-10-CM | POA: Diagnosis not present

## 2015-07-09 DIAGNOSIS — M1710 Unilateral primary osteoarthritis, unspecified knee: Secondary | ICD-10-CM | POA: Diagnosis not present

## 2015-07-09 DIAGNOSIS — E039 Hypothyroidism, unspecified: Secondary | ICD-10-CM | POA: Diagnosis not present

## 2015-07-09 DIAGNOSIS — M19019 Primary osteoarthritis, unspecified shoulder: Secondary | ICD-10-CM | POA: Insufficient documentation

## 2015-07-09 DIAGNOSIS — I10 Essential (primary) hypertension: Secondary | ICD-10-CM | POA: Diagnosis not present

## 2015-07-09 DIAGNOSIS — Z79899 Other long term (current) drug therapy: Secondary | ICD-10-CM | POA: Diagnosis not present

## 2015-07-09 DIAGNOSIS — G4751 Confusional arousals: Secondary | ICD-10-CM | POA: Diagnosis not present

## 2015-07-09 DIAGNOSIS — R531 Weakness: Secondary | ICD-10-CM | POA: Diagnosis present

## 2015-07-09 HISTORY — DX: Traumatic subdural hemorrhage with loss of consciousness of unspecified duration, initial encounter: S06.5X9A

## 2015-07-09 HISTORY — DX: Traumatic subdural hemorrhage with loss of consciousness status unknown, initial encounter: S06.5XAA

## 2015-07-09 LAB — COMPREHENSIVE METABOLIC PANEL
ALBUMIN: 3.9 g/dL (ref 3.5–5.0)
ALT: 11 U/L — ABNORMAL LOW (ref 14–54)
AST: 14 U/L — AB (ref 15–41)
Alkaline Phosphatase: 123 U/L (ref 38–126)
Anion gap: 8 (ref 5–15)
BUN: 10 mg/dL (ref 6–20)
CHLORIDE: 105 mmol/L (ref 101–111)
CO2: 27 mmol/L (ref 22–32)
Calcium: 9.5 mg/dL (ref 8.9–10.3)
Creatinine, Ser: 0.82 mg/dL (ref 0.44–1.00)
GFR calc Af Amer: >60 mL/min (ref >60)
GLUCOSE: 96 mg/dL (ref 65–99)
POTASSIUM: 3.7 mmol/L (ref 3.5–5.1)
SODIUM: 140 mmol/L (ref 135–145)
Total Bilirubin: 0.8 mg/dL (ref 0.3–1.2)
Total Protein: 7.2 g/dL (ref 6.5–8.1)

## 2015-07-09 LAB — URINALYSIS, ROUTINE W REFLEX MICROSCOPIC
BILIRUBIN URINE: NEGATIVE
Glucose, UA: NEGATIVE mg/dL
Ketones, ur: NEGATIVE mg/dL
NITRITE: NEGATIVE
PH: 6 (ref 5.0–8.0)
Protein, ur: NEGATIVE mg/dL
SPECIFIC GRAVITY, URINE: 1.017 (ref 1.005–1.030)

## 2015-07-09 LAB — CBC
HEMATOCRIT: 41.6 % (ref 36.0–46.0)
Hemoglobin: 13.6 g/dL (ref 12.0–15.0)
MCH: 29.4 pg (ref 26.0–34.0)
MCHC: 32.7 g/dL (ref 30.0–36.0)
MCV: 90 fL (ref 78.0–100.0)
Platelets: 225 10*3/uL (ref 150–400)
RBC: 4.62 MIL/uL (ref 3.87–5.11)
RDW: 14.4 % (ref 11.5–15.5)
WBC: 7.6 10*3/uL (ref 4.0–10.5)

## 2015-07-09 LAB — RAPID URINE DRUG SCREEN, HOSP PERFORMED
AMPHETAMINES: NOT DETECTED
Barbiturates: NOT DETECTED
Benzodiazepines: NOT DETECTED
COCAINE: NOT DETECTED
OPIATES: NOT DETECTED
TETRAHYDROCANNABINOL: NOT DETECTED

## 2015-07-09 LAB — CBG MONITORING, ED: Glucose-Capillary: 78 mg/dL (ref 65–99)

## 2015-07-09 LAB — APTT: aPTT: 26 seconds (ref 24–37)

## 2015-07-09 LAB — URINE MICROSCOPIC-ADD ON

## 2015-07-09 LAB — PROTIME-INR
INR: 1.1 (ref 0.00–1.49)
Prothrombin Time: 14.4 seconds (ref 11.6–15.2)

## 2015-07-09 NOTE — ED Provider Notes (Signed)
CSN: [id number]     Arrival date & time 07/09/15  1244 History   First MD Initiated Contact with Patient 07/09/15 1315     Chief Complaint  Patient presents with  . Weakness  . Altered Mental Status     (Consider location/radiation/quality/duration/timing/severity/associated sxs/prior Treatment) The history is provided by the patient (and Pt's daughter and son in law). The history is limited by the condition of the patient. No language interpreter was used.     Patient is a 81 year old female with past medical history of subdural hematoma August 2016 status post craniotomy hypertension, hyperlipidemia, GERD, she reports emergency room with her daughter with concerns of increasing confusion which seems similar to when her mother was diagnosed with a subdural hematoma.  She states that since she had surgery she was gradually improving with her function in cognition up until the beginning of this month when she has had 3 weeks of worsening short-term memory and acceleration of that more dramatically in the past week with shaking, generalized weakness, fatigue.  Her daughter states that she visited her at home where she lives alone, and assisted her going to the mailbox and she could not find her mailbox and had no idea how to work the key. The patient states that she is fine and without any complaints, including shortness of breath, chest pain, abdominal pain, cough, dysuria, headache.  She claims she's been eating and drinking normally and taking her medications as prescribed. Her daughter monitors her activity based on her cell phone and states that 2 weeks ago she rarely slapped and last week she slept excessively. When he went out for errands she was extremely fatigued. She denies any other known symptoms her mother including exertional shortness of breath, pallor, nausea, vomiting, decreased appetite, weight loss. Your daughter has called the neurosurgeon without any response. She contacted her  regular doctor, and efforts to try and get a repeated head CT due to her concerns that her behavior was similar to when she had subdural hematoma.  The only other change the daughter mentions in the past several weeks is that PT OT had recently stopped coming to the home, and November 9 but galantamine dosing doubled from 4 mg to 8 mg. She otherwise has not had any change in medication.    Past Medical History  Diagnosis Date  . Hypertension   . Hypothyroidism   . GERD (gastroesophageal reflux disease)   . SVD (spontaneous vaginal delivery)     x 3  . Hyperlipidemia     diet controlled - no meds  . Arthritis     shoulder, knee  - otc med prn  . Cancer ([place])   . Subdural hematoma [facility])    Past Surgical History  Procedure Laterality Date  . Total thyroidectomy    . Breast surgery      right breast duct   . Colonoscopy    . Tooth extraction    . Hysteroscopy w/d&c N/A 09/15/2013    Procedure: DILATATION AND CURETTAGE /HYSTEROSCOPY WITH UTERINE POLYP RESECTION;  Surgeon: Eldred Manges, MD;  Location: [facility];  Service: Gynecology;  Laterality: N/A;  . Craniotomy Left 03/25/2015    Procedure: CRANIOTOMY HEMATOMA EVACUATION SUBDURAL;  Surgeon: Consuella Lose, MD;  Location: [facility];  Service: Neurosurgery;  Laterality: Left;   History reviewed. No pertinent family history. Social History  Substance Use Topics  . Smoking status: Former Smoker -- 0.15 packs/day    Types: Cigarettes    Quit date:  08/13/1951  . Smokeless tobacco: Never Used  . Alcohol Use: Yes     Comment: occasionally   OB History    No data available     Review of Systems  Constitutional: Positive for activity change and fatigue. Negative for fever, chills, diaphoresis and appetite change.  HENT: Negative.   Eyes: Negative.   Respiratory: Negative.   Cardiovascular: Negative.   Gastrointestinal: Negative.   Endocrine: Negative.   Genitourinary: Negative.   Musculoskeletal: Negative.   Skin:  Negative.   Allergic/Immunologic: Negative.   Neurological: Negative.   Psychiatric/Behavioral: Positive for confusion. Negative for behavioral problems, dysphoric mood, decreased concentration and agitation.      Allergies  Aricept; Benzonatate; Cefuroxime; Donepezil; Fish oil; Gabapentin; Statins; Ezetimibe; Loratadine; and Tramadol  Home Medications   Prior to Admission medications   Medication Sig Start Date End Date Taking? Authorizing Provider  acetaminophen (TYLENOL) 500 MG tablet Take 1,000 mg by mouth every 6 (six) hours as needed for moderate pain.   Yes Historical Provider, MD  calcium carbonate (TUMS) 500 MG chewable tablet Chew 2 tablets by mouth daily as needed. 11/29/09  Yes Historical Provider, MD  cycloSPORINE (RESTASIS) 0.05 % ophthalmic emulsion Place 1 drop into both eyes 2 (two) times daily.   Yes Historical Provider, MD  galantamine (RAZADYNE) 4 MG tablet Take 8 mg by mouth 2 (two) times daily.  03/24/15 03/23/16 Yes Historical Provider, MD  lisinopril (PRINIVIL,ZESTRIL) 20 MG tablet Take 20 mg by mouth daily.   Yes Historical Provider, MD  ranitidine (ZANTAC) 150 MG tablet Take 150 mg by mouth 2 (two) times daily. 02/06/15 02/06/16 Yes Historical Provider, MD  SYNTHROID 75 MCG tablet Take 75 mcg by mouth daily. 01/30/15  Yes Historical Provider, MD   BP 130/73 mmHg  Pulse 59  Temp(Src) 98.8 F (37.1 C) (Oral)  Resp 13  SpO2 100% Physical Exam  Constitutional: She is oriented to person, place, and time. She appears well-developed and well-nourished. No distress.  Elderly female, well-developed, well-nourished, non-toxic in appearance, pleasant and alert with no acute distress  HENT:  Head: Normocephalic and atraumatic.  Nose: Nose normal.  Mouth/Throat: Oropharynx is clear and moist. No oropharyngeal exudate.  Eyes: Conjunctivae and EOM are normal. Pupils are equal, round, and reactive to light. Right eye exhibits no discharge. Left eye exhibits no discharge. No  scleral icterus.  Neck: Normal range of motion. No JVD present. No tracheal deviation present. No thyromegaly present.  Cardiovascular: Normal rate, regular rhythm, normal heart sounds and intact distal pulses.  Exam reveals no gallop and no friction rub.   No murmur heard. Pulses:      Radial pulses are 2+ on the right side, and 2+ on the left side.       Dorsalis pedis pulses are 2+ on the right side, and 2+ on the left side.  REFILL andNormal capillary refill   Pulmonary/Chest: Effort normal and breath sounds normal. No respiratory distress. She has no wheezes. She has no rales. She exhibits no tenderness.  Course breath sounds bilaterally at the bases without wheeze or rhonchi, no respiratory distress  Abdominal: Soft. Bowel sounds are normal. She exhibits no distension and no mass. There is no tenderness. There is no rebound and no guarding.  Musculoskeletal: Normal range of motion. She exhibits no edema or tenderness.  Lymphadenopathy:    She has no cervical adenopathy.  Neurological: She is alert and oriented to person, place, and time. She has normal reflexes. No cranial nerve deficit. She  exhibits normal muscle tone. Coordination normal.  Speech is clear, follows commands, A&O to person, place, time Major Cranial nerves without deficit, no facial droop Normal strength in upper and lower extremities bilaterally including dorsiflexion and plantar flexion, strong and equal grip strength Sensation normal to light and sharp touch Moves extremities without ataxia, coordination intact Normal finger to nose and rapid alternating movements Neg romberg, no pronator drift Normal gait and balance   Skin: Skin is warm and dry. No rash noted. She is not diaphoretic. No erythema. No pallor.  Psychiatric: She has a normal mood and affect. Her speech is normal and behavior is normal. Judgment and thought content normal. Cognition and memory are impaired. She exhibits abnormal recent memory and  abnormal remote memory.  Nursing note and vitals reviewed.   ED Course  Procedures (including critical care time) Labs Review Labs Reviewed  COMPREHENSIVE METABOLIC PANEL - Abnormal; Notable for the following:    AST 14 (*)    ALT 11 (*)    All other components within normal limits  URINALYSIS, ROUTINE W REFLEX MICROSCOPIC (NOT AT [facility]) - Abnormal; Notable for the following:    APPearance CLOUDY (*)    Hgb urine dipstick TRACE (*)    Leukocytes, UA SMALL (*)    All other components within normal limits  URINE MICROSCOPIC-ADD ON - Abnormal; Notable for the following:    Squamous Epithelial / LPF 6-30 (*)    Bacteria, UA RARE (*)    All other components within normal limits  CBC  APTT  URINE RAPID DRUG SCREEN, HOSP PERFORMED  PROTIME-INR  CBG MONITORING, ED    Imaging Review Dg Chest 2 View  07/09/2015  CLINICAL DATA:  Weakness. EXAM: CHEST  2 VIEW COMPARISON:  None. FINDINGS: Cardiac silhouette is normal in size. Thoracic aorta is mildly tortuous. The lungs are mildly hyperinflated. 3 mm density projecting over the right upper lung likely represents a calcified granuloma. Minimal scarring is noted in the left lung base. No confluent airspace opacity, edema, pleural effusion, or pneumothorax is identified. Thoracic spondylosis is noted. IMPRESSION: No active cardiopulmonary disease. Electronically Signed   By: Logan Bores M.D.   On: 07/09/2015 15:30   Ct Head Wo Contrast  07/09/2015  CLINICAL DATA:  Weakness and confusion for 1 week, history of hypertension and subdural hematoma EXAM: CT HEAD WITHOUT CONTRAST TECHNIQUE: Contiguous axial images were obtained from the base of the skull through the vertex without intravenous contrast. COMPARISON:  03/26/2015 FINDINGS: Findings consistent with status post left craniotomy. Calvarium intact otherwise. Diffuse atrophy with white matter low attenuation. No evidence of acute vascular territory infarct or mass. No hemorrhage or extra-axial  fluid. IMPRESSION: Severe chronic involutional change with no acute findings in this patient with prior subdural hematoma on the left. No evidence of extra-axial fluid collection today. Electronically Signed   By: Skipper Cliche M.D.   On: 07/09/2015 14:27   I have personally reviewed and evaluated these images and lab results as part of my medical decision-making.   EKG Interpretation   Date/Time:  Sunday July 09 2015 13:09:42 EST Ventricular Rate:  69 PR Interval:  194 QRS Duration: 74 QT Interval:  398 QTC Calculation: 426 R Axis:   -6 Text Interpretation:  Normal sinus rhythm Normal ECG Sinus rhythm Artifact  Abnormal ekg Confirmed by Carmin Muskrat  MD ([id number]) on 07/09/2015  2:04:36 PM      MDM   Final diagnoses:  Weakness  Confusion    81 year old female patient  with 3 weeks of progressive weakness and confusion She is status post craniotomy for subdural hematoma, August 2016  Patient presented with stable vitals, mild hypertension She is alert and oriented 3, to person, place, time. She did give her wrong birth year to the nurse, and is having trouble with her short-term memory. She does live alone, and ambulatory at her home without any difficulties.  Her daughter has concerns due to her generalized weakness, a small tremor in her hands, and worsening cognition.  Workup was initiated for altered mental status/weakness, including urinalysis, urine drug screen, CBG monitoring, CMP, CBC, coags, EKG, chest x-ray, head CT  On exam the patient is well-appearing, alert, answering questions mostly appropriately and following commands without difficulty. She does not appear septic or ill, she is afebrile. She is a normal cardiac and abdominal exam. She is without any neurological deficit.  She had coarse breath sounds bilaterally the bases otherwise had normal pulmonary exam.  Head CT was negative for acute intracranial pathology, chest x-ray was negative for active  cardiopulmonary disease, basic lab work was unremarkable.  Urinalysis was contaminated, with many epithelial cells and rare bacteria.  I discussed the findings with the patient and her daughter suggesting that we could try second collection or an in and out catheterization however they declined. There is no indication to treat with antibiotics.  Her EKG was normal sinus rhythm without any ST elevation.  All findings were reviewed with family.  They're given the option to admit the patient for acute delirium with subsequent workup which may include further imaging of the brain. They declined and were relieved with a normal head CT.  They were offered home health eval which they felt was appropriate.  At time of discharge the patient was went to go home to stay with her daughter for the next several weeks while home health evaluation is performed. Case management was consulted and will assist the family to arrange further follow-up.  The patient was seen in a shared visit with Dr. Vanita Panda, who has seen and evaluated the pt and agrees with workup and assessment.  The family had time with both myself and Dr. Vanita Panda to have questions asked and answered.   Delsa Grana, PA-C 07/09/15 1716  Carmin Muskrat, MD 07/15/15 [phone]

## 2015-07-09 NOTE — Care Management Note (Signed)
Case Management Note  Patient Details  Name: Rebecca Travis MRN: [id number] Date of Birth: [birth year]  Subjective/Objective:   81 y.o. F brought to the ED after 3 weeks of deterioration in memory which was concerning for adult daughter. Pt had subdural Hematoma Aug 2016 with Craniotomy and had been progressing steadily until then. CM consulted for [facility] referral d/t weakness, poor appetite, AMS and concerns about pts ability to manage her own medications Independently.                   Action/Plan: pt will be discharged home with Daughter and Son in [place]. HHRN, SW and Aide through [facility] arranged (faxed to office with confirmation). After discussion with family at bedside CM learned pt is to be admitted to [facility] which is an [facility] on or around 12/31 of this year. HHSW may be able to assist with this process as well as other issues the family is dealing with in preparation for placement. CM will sign off.    Expected Discharge Date:                  Expected Discharge Plan:  [place]  In-House Referral:     Discharge planning Services  CM Consult  Post Acute Care Choice:    Choice offered to:  Patient, Adult Children  DME Arranged:    DME Agency:  [facility] Arranged:  RN, Nurse's Aide, Social Work [employer] Agency:  [place]  Status of Service:  Completed, signed off  Medicare Important Message Given:    Date Medicare IM Given:    Medicare IM give by:    Date Additional Medicare IM Given:    Additional Medicare Important Message give by:     If discussed at [place] of Stay Meetings, dates discussed:    Additional Comments:  Delrae Sawyers, RN 07/09/2015, 5:43 PM

## 2015-07-09 NOTE — ED Notes (Addendum)
Daughter states over this week the pt has been weak and confused, reminds her of when her mother had a "brain bleed" in August. Daughter tried to contact neurosurgeon but could not get a call back due to holidays and symptoms have not improved over the week so she decided to bring her here. She denies pain. She alert to person and place but not time. Daughter states mom was confused about house keys and other details this week. Grips = bilaterally, no arm drift or facial droop. She took all of her daily meds at one time today, pta.

## 2015-07-09 NOTE — Progress Notes (Signed)
CSW received consult by MD regarding patient's interest in [place]. CSW informed RNCM Edmonia Lynch about this case. CSW to sign off as this is not a CSW referral.   Please consult if CSW needs arise.  Lucius Conn, [place] [facility] Ph: [phone]

## 2015-07-09 NOTE — ED Notes (Signed)
The pt just returned from  Xray family at the bedside   No complaints

## 2015-07-09 NOTE — ED Notes (Signed)
Pt waiting on case management   According to family

## 2015-07-09 NOTE — Discharge Instructions (Signed)
Delirium Delirium is a state of mental confusion. It comes on quickly and causes significant changes in a person's thinking and behavior. People with delirium usually have trouble paying attention to what is going on or knowing where they are. They may become very withdrawn or very emotional and unable to sit still. They may even see or feel things that are not there (hallucinations). Delirium is a sign of a serious underlying medical condition. CAUSES Delirium occurs when something suddenly affects the signals that the brain sends out. Brain signals can be affected by anything that puts severe stress on the body and brain and causes brain chemicals to be out of balance. The most common causes of delirium include:  Infections. These may be bacterial, viral, fungal, or protozoal.  Medicines. These include many over-the-counter and prescription medicines.  Recreational drugs.  Substance withdrawal. This occurs with sudden discontinuation of alcohol, certain medicines, or recreational drugs.  Surgery.  Sudden vascular events, such as stroke, brain hemorrhage, and severe migraine.  Other brain disorders, such as tumors, seizures, and physical head trauma.  Metabolic disorders, such as kidney or liver failure.  Low blood oxygen (anoxia). This may occur with lung disease, cardiac arrest, or carbon monoxide poisoning.  Hormone imbalances (endocrinopathies), such as an overactive thyroid (hyperthyroidism) or underactive thyroid (hypothyroidism).  Vitamin deficiencies. RISK FACTORS This condition is more likely to develop in:  Children.  Older people.  People who live alone.  People who have vision loss or hearing loss.  People who have existing brain disease, such as dementia.  People who have long-lasting (chronic) medical conditions, such as heart disease.  People who are hospitalized for long periods of time. SYMPTOMS Delirium starts with a sudden change in a person's thinking  or behavior. Symptoms come and go (fluctuate) over time, and they are often worse at the end of the day. Symptoms include:  Not being able to stay awake (drowsiness) or pay attention.  Being confused about places, time, and people.  Forgetfulness.  Having extreme energy levels. These may be low or high.  Changes in sleep patterns.  Extreme mood swings, such as anger or anxiety.  Focusing on things or ideas that are not important.  Rambling and senseless talking.  Difficulty speaking, understanding speech, or both.  Hallucinations.  Tremor or unsteady gait. DIAGNOSIS People with delirium may not realize that they have the condition. Often, a family member or health care provider is the first person to notice the changes. The health care provider will obtain a detailed history of current symptoms, medical issues, medicines, and recreational drug use. The health care provider will perform a mental status examination by:  Asking questions to check for confusion.  Watching for abnormal behavior. The health care provider may perform a physical exam and order lab tests or additional studies to determine the cause of the delirium. TREATMENT Treatment of delirium depends on the cause and severity. Delirium usually goes away within days or weeks of treating the underlying cause. In the meantime, the person should not be left alone because he or she may accidentally cause self-harm. Treatment includes supportive care, such as:  Increased light during the day and decreased light at night.  Low noise level.  Uninterrupted sleep.  A regular daily schedule.  Clocks and calendars to help with orientation.  Familiar objects, including the person's pictures and clothing.  Frequent visits from familiar family and friends.  Healthy diet.  Exercise. In more severe cases of delirium, medicine may be prescribed  to help the person to keep calm and think more clearly. HOME CARE  INSTRUCTIONS  Any supportive care should be continued as told by the health care provider.  All medicines should be used as told by the health care provider. This is important.  The health care provider should be consulted before over-the-counter medicines, herbs, or supplements are used.  All follow-up visits should be kept as told by the health care provider. This is important.  Alcohol and recreational drugs should be avoided as told by the health care provider. SEEK MEDICAL CARE IF:  Symptoms do not get better or they become worse.  New symptoms of delirium develop.  Caring for the person at home does not seem safe.  Eating, drinking, or communicating stops.  There are side effects of medicines, such as changes in sleep patterns, dizziness, weight gain, restlessness, movement changes, or tremors. SEEK IMMEDIATE MEDICAL CARE IF:  Serious thoughts occur about self-harm or about hurting others.  There are serious side effects of medicine, such as:  Swelling of the face, lips, tongue, or throat.  Fever, confusion, muscle spasms, or seizures.   This information is not intended to replace advice given to you by your health care provider. Make sure you discuss any questions you have with your health care provider.   Document Released: 04/22/2012 Document Revised: 12/13/2014 Document Reviewed: 09/21/2014 Elsevier Interactive Patient Education 2016 [employer].  Fatigue Fatigue is feeling tired all of the time, a lack of energy, or a lack of motivation. Occasional or mild fatigue is often a normal response to activity or life in general. However, long-lasting (chronic) or extreme fatigue may indicate an underlying medical condition. HOME CARE INSTRUCTIONS  Watch your fatigue for any changes. The following actions may help to lessen any discomfort you are feeling:  Talk to your health care provider about how much sleep you need each night. Try to get the required amount every  night.  Take medicines only as directed by your health care provider.  Eat a healthy and nutritious diet. Ask your health care provider if you need help changing your diet.  Drink enough fluid to keep your urine clear or pale yellow.  Practice ways of relaxing, such as yoga, meditation, massage therapy, or acupuncture.  Exercise regularly.   Change situations that cause you stress. Try to keep your work and personal routine reasonable.  Do not abuse illegal drugs.  Limit alcohol intake to no more than 1 drink per day for nonpregnant women and 2 drinks per day for men. One drink equals 12 ounces of beer, 5 ounces of wine, or 1 ounces of hard liquor.  Take a multivitamin, if directed by your health care provider. SEEK MEDICAL CARE IF:   Your fatigue does not get better.  You have a fever.   You have unintentional weight loss or gain.  You have headaches.   You have difficulty:   Falling asleep.  Sleeping throughout the night.  You feel angry, guilty, anxious, or sad.   You are unable to have a bowel movement (constipation).   You skin is dry.   Your legs or another part of your body is swollen.  SEEK IMMEDIATE MEDICAL CARE IF:   You feel confused.   Your vision is blurry.  You feel faint or pass out.   You have a severe headache.   You have severe abdominal, pelvic, or back pain.   You have chest pain, shortness of breath, or an irregular or fast  heartbeat.   You are unable to urinate or you urinate less than normal.   You develop abnormal bleeding, such as bleeding from the rectum, vagina, nose, lungs, or nipples.  You vomit blood.   You have thoughts about harming yourself or committing suicide.   You are worried that you might harm someone else.    This information is not intended to replace advice given to you by your health care provider. Make sure you discuss any questions you have with your health care provider.   Document  Released: 05/26/2007 Document Revised: 08/19/2014 Document Reviewed: 11/30/2013 Elsevier Interactive Patient Education 2016 [employer].  Confusion Confusion is the inability to think with your usual speed or clarity. Confusion may come on quickly or slowly over time. How quickly the confusion comes on depends on the cause. Confusion can be due to any number of causes. CAUSES   Concussion, head injury, or head trauma.  Seizures.  Stroke.  Fever.  Brain tumor.  Age related decreased brain function (dementia).  Heightened emotional states like rage or terror.  Mental illness in which the person loses the ability to determine what is real and what is not (hallucinations).  Infections such as a urinary tract infection (UTI).  Toxic effects from alcohol, drugs, or prescription medicines.  Dehydration and an imbalance of salts in the body (electrolytes).  Lack of sleep.  Low blood sugar (diabetes).  Low levels of oxygen from conditions such as chronic lung disorders.  Drug interactions or other medicine side effects.  Nutritional deficiencies, especially niacin, thiamine, vitamin C, or vitamin B.  Sudden drop in body temperature (hypothermia).  Change in routine, such as when traveling or hospitalized. SIGNS AND SYMPTOMS  People often describe their thinking as cloudy or unclear when they are confused. Confusion can also include feeling disoriented. That means you are unaware of where or who you are. You may also not know what the date or time is. If confused, you may also have difficulty paying attention, remembering, and making decisions. Some people also act aggressively when they are confused.  DIAGNOSIS  The medical evaluation of confusion may include:  Blood and urine tests.  X-rays.  Brain and nervous system tests.  Analyzing your brain waves (electroencephalogram or EEG).  Magnetic resonance imaging (MRI) of your head.  Computed tomography (CT) scan of  your head.  Mental status tests in which your health care provider may ask many questions. Some of these questions may seem silly or strange, but they are a very important test to help diagnose and treat confusion. TREATMENT  An admission to the hospital may not be needed, but a person with confusion should not be left alone. Stay with a family member or friend until the confusion clears. Avoid alcohol, pain relievers, or sedative drugs until you have fully recovered. Do not drive until directed by your health care provider. HOME CARE INSTRUCTIONS  What family and friends can do:  To find out if someone is confused, ask the person to state his or her name, age, and the date. If the person is unsure or answers incorrectly, he or she is confused.  Always introduce yourself, no matter how well the person knows you.  Often remind the person of his or her location.  Place a calendar and clock near the confused person.  Help the person with his or her medicines. You may want to use a pill box, an alarm as a reminder, or give the person each dose as prescribed.  Talk about current events and plans for the day.  Try to keep the environment calm, quiet, and peaceful.  Make sure the person keeps follow-up visits with his or her health care provider. PREVENTION  Ways to prevent confusion:  Avoid alcohol.  Eat a balanced diet.  Get enough sleep.  Take medicine only as directed by your health care provider.  Do not become isolated. Spend time with other people and make plans for your days.  Keep careful watch on your blood sugar levels if you are diabetic. SEEK IMMEDIATE MEDICAL CARE IF:   You develop severe headaches, repeated vomiting, seizures, blackouts, or slurred speech.  There is increasing confusion, weakness, numbness, restlessness, or personality changes.  You develop a loss of balance, have marked dizziness, feel uncoordinated, or fall.  You have delusions, hallucinations,  or develop severe anxiety.  Your family members think you need to be rechecked.   This information is not intended to replace advice given to you by your health care provider. Make sure you discuss any questions you have with your health care provider.   Document Released: 09/05/2004 Document Revised: 08/19/2014 Document Reviewed: 09/03/2013 Elsevier Interactive Patient Education [employer].

## 2015-07-12 ENCOUNTER — Other Ambulatory Visit: Payer: Self-pay | Admitting: Geriatric Medicine

## 2015-07-12 DIAGNOSIS — S065XAA Traumatic subdural hemorrhage with loss of consciousness status unknown, initial encounter: Secondary | ICD-10-CM

## 2015-07-12 DIAGNOSIS — S065X9A Traumatic subdural hemorrhage with loss of consciousness of unspecified duration, initial encounter: Secondary | ICD-10-CM

## 2015-07-13 ENCOUNTER — Other Ambulatory Visit: Payer: Medicare Other

## 2015-08-17 ENCOUNTER — Encounter: Payer: Self-pay | Admitting: *Deleted

## 2015-08-17 ENCOUNTER — Encounter: Payer: Self-pay | Admitting: Neurology

## 2015-08-17 ENCOUNTER — Ambulatory Visit (INDEPENDENT_AMBULATORY_CARE_PROVIDER_SITE_OTHER): Payer: Medicare Other | Admitting: Neurology

## 2015-08-17 VITALS — BP 165/88 | HR 84 | Wt 149.8 lb

## 2015-08-17 DIAGNOSIS — R413 Other amnesia: Secondary | ICD-10-CM | POA: Diagnosis not present

## 2015-08-17 DIAGNOSIS — F0390 Unspecified dementia without behavioral disturbance: Secondary | ICD-10-CM

## 2015-08-17 DIAGNOSIS — R251 Tremor, unspecified: Secondary | ICD-10-CM

## 2015-08-17 DIAGNOSIS — R410 Disorientation, unspecified: Secondary | ICD-10-CM

## 2015-08-17 DIAGNOSIS — F05 Delirium due to known physiological condition: Secondary | ICD-10-CM

## 2015-08-17 MED ORDER — RIVASTIGMINE 4.6 MG/24HR TD PT24: 4.6000 mg | MEDICATED_PATCH | Freq: Every day | TRANSDERMAL | Status: DC

## 2015-08-17 NOTE — Progress Notes (Signed)
[facility]    Provider:  Dr Jaynee Eagles Referring Provider: Lajean Manes, MD Primary Care Physician:  Mathews Argyle, MD  CC:  Memory loss  HPI:  Rebecca Travis is a 82 y.o. female here as a referral from Dr. Felipa Eth for memory problems. In August she underwent evacuation of a chronic subdural hematoma due to a fall. Since the surgery in August, she has been having memory problems. Mostly since the fall and hematoma. Daughter is with patient and provides most information, daughter says she fell and was having headaches and 8 weeks afterwards she was diagnosed with a hematoma. She had surgery. She was put on memory medication, galantamine, but she had increased fatigue and the medication was stopped. She has also started having tremors. Started in November. Stopping the galantamine did not help the tremors. Memory changes are progressive and worsening rapidly. She had baseline memory problems before the fall. She would get confused but she was living independently before the fall, she was active and independent, she was [employer]. Memory problems first started at least a few months before the fall, she would forget appointment and times. She would have to write everything down. Tremor in the hands and tremor in the mouth. She never had tremors before the bleed oruntil  after the surgery. She had the surgery and did better but has been declining ever since then. She had nightmares on the Aricept. She has depression after all of this. There have been a lot of changes. She is in independent living now. Patient says she feels very sad. She is not happy. The lack of independence is worrisome. She is in a new environment in independent living. She has fatigue, not exercising like she used, she has PT twice a week. She has confusion sometimes, the phone was ringing and she picked up the remote control. Forgetting to turn the alarm system off or on. More short-term meory issues,  she gets distracted, difficulty focusing.   Reviewed notes, labs and imaging from outside physicians, which showed:  [facility]: Patient was seen by Dr Kathyrn Sheriff  Who performed the evacuation of the chronic subdural hematoma. He states that after that she had improvement in headaches but continues to experience generalized fatigue as well as bilateral tremor which is worse in the morning and continued difficulty with memory. She was taken to the emergency department for these complaints at the end of November when repeat CT scan was done which was essentially normal without evidence of any further left-sided subdural hematoma. There were subsequently seen by their geriatrician Actigraph galantamine. Fatigue has improved but the rest of the symptoms are unchanged and she was sent here for evaluation.  CT of the head 06/2015: personally reviewed images and agree with findings:: Findings consistent with status post left craniotomy. Calvarium intact otherwise. Diffuse atrophy with white matter low attenuation. No evidence of acute vascular territory infarct or mass. No hemorrhage or extra-axial fluid.  IMPRESSION: Severe chronic involutional change with no acute findings in this patient with prior subdural hematoma on the left. No evidence of extra-axial fluid collection today.  Review of Systems: Patient complains of symptoms per HPI as well as the following symptoms: No CP, no SOB. Pertinent negatives per HPI. All others negative.   Social History   Social History  . Marital Status: Divorced    Spouse Name: N/A  . Number of Children: N/A  . Years of Education: N/A   Occupational History  .  Retired    Social History Main Topics  . Smoking status: Former Smoker -- 0.15 packs/day    Types: Cigarettes    Quit date: 08/13/1951  . Smokeless tobacco: Never Used  . Alcohol Use: 0.0 oz/week    0 Standard drinks or equivalent per week     Comment: 1  glass wine per week  . Drug Use: No  . Sexual Activity: Not Currently   Other Topics Concern  . Not on file   Social History Narrative   Lives alone   Caffeine use: none     Family History  Problem Relation Age of Onset  . Stroke Father   . Dementia Neg Hx     Past Medical History  Diagnosis Date  . Hypertension   . Hypothyroidism   . GERD (gastroesophageal reflux disease)   . SVD (spontaneous vaginal delivery)     x 3  . Hyperlipidemia     diet controlled - no meds  . Arthritis     shoulder, knee  - otc med prn  . Cancer ([place])   . Subdural hematoma [facility])     Past Surgical History  Procedure Laterality Date  . Total thyroidectomy    . Breast surgery      right breast duct   . Colonoscopy    . Tooth extraction    . Hysteroscopy w/d&c N/A 09/15/2013    Procedure: DILATATION AND CURETTAGE /HYSTEROSCOPY WITH UTERINE POLYP RESECTION;  Surgeon: Eldred Manges, MD;  Location: [facility];  Service: Gynecology;  Laterality: N/A;  . Craniotomy Left 03/25/2015    Procedure: CRANIOTOMY HEMATOMA EVACUATION SUBDURAL;  Surgeon: Consuella Lose, MD;  Location: [facility];  Service: Neurosurgery;  Laterality: Left;    Current Outpatient Prescriptions  Medication Sig Dispense Refill  . acetaminophen (TYLENOL) 500 MG tablet Take 1,000 mg by mouth every 6 (six) hours as needed for moderate pain.    . calcium carbonate (TUMS) 500 MG chewable tablet Chew 2 tablets by mouth daily as needed.    . cycloSPORINE (RESTASIS) 0.05 % ophthalmic emulsion Place 1 drop into both eyes 2 (two) times daily.    Marland Kitchen FLUZONE HIGH-DOSE 0.5 ML SUSY Inject 1 Dose into the muscle once.  0  . lisinopril (PRINIVIL,ZESTRIL) 20 MG tablet Take 20 mg by mouth daily.    . ranitidine (ZANTAC) 150 MG tablet Take 150 mg by mouth 2 (two) times daily.    Marland Kitchen SYNTHROID 75 MCG tablet Take 75 mcg by mouth daily.    . rivastigmine (EXELON) 4.6 mg/24hr Place 1 patch (4.6 mg total) onto the skin daily. 30 patch 12   No  current facility-administered medications for this visit.    Allergies as of 08/17/2015 - Review Complete 08/17/2015  Allergen Reaction Noted  . Aricept [donepezil hcl] Other (See Comments) 03/25/2015  . Benzonatate Diarrhea 03/25/2015  . Cefuroxime Diarrhea 03/25/2015  . Donepezil Other (See Comments)   . Fish oil Diarrhea and Other (See Comments) 03/25/2015  . Gabapentin Other (See Comments) 03/25/2015  . Statins Other (See Comments) 03/25/2015  . Ezetimibe Other (See Comments) 07/09/2015  . Loratadine Other (See Comments) 03/25/2015  . Tramadol Other (See Comments) 07/09/2015    Vitals: BP 165/88 mmHg  Pulse 84  Wt 149 lb 12.8 oz (67.949 kg)  SpO2 96% Last Weight:  Wt Readings from Last 1 Encounters:  08/17/15 149 lb 12.8 oz (67.949 kg)   Last Height:   Ht Readings from Last 1 Encounters:  05/20/15 5\' 4"  (  1.626 m)    Physical exam: Exam: Gen: NAD, conversant, well nourised, obese, well groomed                     CV: RRR, no MRG. No Carotid Bruits. No peripheral edema, warm, nontender Eyes: Conjunctivae clear without exudates or hemorrhage  Neuro: Detailed Neurologic Exam  Speech:    Speech is normal; fluent and spontaneous with normal comprehension.  Cognition:    The patient is oriented to person [facility] Cognitive Assessment  08/17/2015  Visuospatial/ Executive (0/5) 0  Naming (0/3) 3  Attention: Read list of digits (0/2) 1  Attention: Read list of letters (0/1) 1  Attention: Serial 7 subtraction starting at 100 (0/3) 0  Language: Repeat phrase (0/2) 2  Language : Fluency (0/1) 0  Abstraction (0/2) 2  Delayed Recall (0/5) 0  Orientation (0/6) 1  Total 10  Adjusted Score (based on education) 11    Cranial Nerves:    The pupils are equal, round, and reactive to light. The fundi areflat. Visual fields are full to finger confrontation. Impaired upgaze otherwise extraocular movements are intact. Trigeminal sensation is intact and the muscles of mastication  are normal. The face is symmetric. The palate elevates in the midline. Hearing intact. Voice is normal. Shoulder shrug is normal. The tongue has normal motion without fasciculations.   Coordination:    Normal finger to nose and heel to shin.   Gait:    Heel-toe and tandem gait are normal.   Motor Observation:    "No no" fine head tremor     Postural tremor high frequency, low amplitude    Minimal dec. Amplitude but ok finger taps Tone:    Normal muscle tone.    Posture:    Posture is normal. normal erect    Strength:    Strength is V/V in the upper and lower limbs.      Sensation: intact to LT     Reflex Exam:  DTR's:    Deep tendon reflexes in the upper and lower extremities are brisk bilaterally.   Toes:    The toes are upgoing bilaterally.   Clonus:    Clonus is absent.      Assessment/Plan:  This is a lovely 82 year old female who had some baseline memory deficits and then fell causing a chronic subdural hematoma with subsequent evacuation. Since then patient's cognition has been further declining. Feel that this is multifactorial, it appears as though patient did have some baseline memory deficits which were worsened by the chronic subdural hematoma however patient is also depressed with fatigue, there have been a lot of changes in her life including a new living situation and what patient describes as loss of independence in her new living situation. I did have a long discussion with patient and her daughter. Patient may not regain her previous baseline before the chronic subdural hematoma. I do recommend following up with primary care for depression and fatigue. Encouraged patient to be social with in her new living situation and be involved with some of the activities offered her independent living facility. The [place] cognitive assessment score is in the dementia range of 11 out of 30.Will check thyroid for tremor, B12, rpr, cmp and cbc for fatigue and memory problems.  Will try Rivastigmine patch and increase as tolerated, then consider Namenda.   Sarina Ill, MD  [facility] [address] [place], [place] [address]  Phone [phone] Fax [phone]

## 2015-08-17 NOTE — Patient Instructions (Signed)
Remember to drink plenty of fluid, eat healthy meals and do not skip any meals. Try to eat protein with a every meal and eat a healthy snack such as fruit or nuts in between meals. Try to keep a regular sleep-wake schedule and try to exercise daily, particularly in the form of walking, 20-30 minutes a day, if you can.   As far as your medications are concerned, I would like to suggest: Rivastigmine patch daily  As far as diagnostic testing: MRi of the brain, labs, eeg  I would like to see you back in 3 months, sooner if we need to. Please call us with any interim questions, concerns, problems, updates or refill requests.   Our phone number is [phone]. We also have an after hours call service for urgent matters and there is a physician on-call for urgent questions. For any emergencies you know to call 911 or go to the nearest emergency room

## 2015-08-18 ENCOUNTER — Telehealth: Payer: Self-pay

## 2015-08-18 LAB — COMPREHENSIVE METABOLIC PANEL
A/G RATIO: 1.8 (ref 1.1–2.5)
ALK PHOS: 135 IU/L — AB (ref 39–117)
ALT: 7 IU/L (ref 0–32)
AST: 10 IU/L (ref 0–40)
Albumin: 4.4 g/dL (ref 3.5–4.7)
BILIRUBIN TOTAL: 0.4 mg/dL (ref 0.0–1.2)
BUN/Creatinine Ratio: 17 (ref 11–26)
BUN: 15 mg/dL (ref 8–27)
CHLORIDE: 105 mmol/L (ref 96–106)
CO2: 28 mmol/L (ref 18–29)
Calcium: 9.9 mg/dL (ref 8.7–10.3)
Creatinine, Ser: 0.86 mg/dL (ref 0.57–1.00)
GFR calc non Af Amer: 61 mL/min/{1.73_m2} (ref >59)
GFR, EST AFRICAN AMERICAN: 70 mL/min/{1.73_m2} (ref >59)
Globulin, Total: 2.5 g/dL (ref 1.5–4.5)
Glucose: 106 mg/dL — ABNORMAL HIGH (ref 65–99)
POTASSIUM: 4.5 mmol/L (ref 3.5–5.2)
Sodium: 149 mmol/L — ABNORMAL HIGH (ref 134–144)
Total Protein: 6.9 g/dL (ref 6.0–8.5)

## 2015-08-18 LAB — B12 AND FOLATE PANEL
FOLATE: 10.4 ng/mL (ref >3.0)
VITAMIN B 12: 482 pg/mL (ref 211–946)

## 2015-08-18 LAB — CBC WITH DIFFERENTIAL/PLATELET
BASOS ABS: 0 10*3/uL (ref 0.0–0.2)
Basos: 1 %
EOS (ABSOLUTE): 0 10*3/uL (ref 0.0–0.4)
Eos: 0 %
HEMOGLOBIN: 13.2 g/dL (ref 11.1–15.9)
Hematocrit: 40.5 % (ref 34.0–46.6)
IMMATURE GRANS (ABS): 0 10*3/uL (ref 0.0–0.1)
Immature Granulocytes: 0 %
LYMPHS: 28 %
Lymphocytes Absolute: 1.8 10*3/uL (ref 0.7–3.1)
MCH: 29.1 pg (ref 26.6–33.0)
MCHC: 32.6 g/dL (ref 31.5–35.7)
MCV: 89 fL (ref 79–97)
MONOCYTES: 7 %
Monocytes Absolute: 0.4 10*3/uL (ref 0.1–0.9)
NEUTROS ABS: 4.2 10*3/uL (ref 1.4–7.0)
Neutrophils: 64 %
PLATELETS: 248 10*3/uL (ref 150–379)
RBC: 4.53 x10E6/uL (ref 3.77–5.28)
RDW: 14.5 % (ref 12.3–15.4)
WBC: 6.5 10*3/uL (ref 3.4–10.8)

## 2015-08-18 LAB — THYROID PANEL WITH TSH
FREE THYROXINE INDEX: 3.3 (ref 1.2–4.9)
T3 Uptake Ratio: 28 % (ref 24–39)
T4 TOTAL: 11.7 ug/dL (ref 4.5–12.0)
TSH: 0.916 u[IU]/mL (ref 0.450–4.500)

## 2015-08-18 LAB — RPR: RPR: NONREACTIVE

## 2015-08-18 NOTE — Telephone Encounter (Signed)
Spoke to daughter Joseph Art. Gave lab results. Daughter verbalized understanding.

## 2015-08-18 NOTE — Telephone Encounter (Signed)
-----   Message from Melvenia Beam, MD sent at 08/18/2015  7:47 AM EST ----- Labs unremarkable thanks

## 2015-08-21 ENCOUNTER — Encounter: Payer: Self-pay | Admitting: Neurology

## 2015-08-21 DIAGNOSIS — R251 Tremor, unspecified: Secondary | ICD-10-CM | POA: Insufficient documentation

## 2015-08-21 DIAGNOSIS — F0390 Unspecified dementia without behavioral disturbance: Secondary | ICD-10-CM | POA: Insufficient documentation

## 2015-08-26 ENCOUNTER — Ambulatory Visit (HOSPITAL_BASED_OUTPATIENT_CLINIC_OR_DEPARTMENT_OTHER)
Admission: RE | Admit: 2015-08-26 | Discharge: 2015-08-26 | Disposition: A | Discharge status: Home / Self Care | Payer: Medicare Other | Source: Ambulatory Visit | Attending: Neurology | Admitting: Neurology

## 2015-08-26 DIAGNOSIS — R413 Other amnesia: Secondary | ICD-10-CM | POA: Diagnosis not present

## 2015-08-26 DIAGNOSIS — F05 Delirium due to known physiological condition: Secondary | ICD-10-CM | POA: Diagnosis not present

## 2015-08-26 DIAGNOSIS — R410 Disorientation, unspecified: Secondary | ICD-10-CM

## 2015-08-28 ENCOUNTER — Telehealth: Payer: Self-pay | Admitting: *Deleted

## 2015-08-28 ENCOUNTER — Telehealth: Payer: Self-pay | Admitting: Neurology

## 2015-08-28 NOTE — Telephone Encounter (Signed)
Patien tis coming in on the 18th to review in the office thanks

## 2015-08-28 NOTE — Telephone Encounter (Signed)
Teresa[facility] [phone] called with call report

## 2015-08-28 NOTE — Telephone Encounter (Signed)
Called Rebecca Travis back from [facility] radiology. She is working from home and I spoke to [employer]. Called report: Routine MRI head. Wondering if we received report. Told her I have not received anything and to fax report to [phone]. She verbalized understanding.

## 2015-08-28 NOTE — Telephone Encounter (Signed)
Scheduled appt for 08/29/15 at 12pm, check in 1145am. Spoke to Joseph Art (daughter). Ok per [employer]. Dr Jaynee Eagles wants to review MRI results with them.

## 2015-08-29 ENCOUNTER — Ambulatory Visit: Payer: Self-pay | Admitting: Neurology

## 2015-08-30 ENCOUNTER — Ambulatory Visit (INDEPENDENT_AMBULATORY_CARE_PROVIDER_SITE_OTHER): Payer: Medicare Other | Admitting: Neurology

## 2015-08-30 ENCOUNTER — Encounter: Payer: Self-pay | Admitting: Neurology

## 2015-08-30 ENCOUNTER — Encounter (INDEPENDENT_AMBULATORY_CARE_PROVIDER_SITE_OTHER): Payer: Self-pay

## 2015-08-30 VITALS — BP 152/70 | HR 63 | Ht 63.0 in | Wt 143.8 lb

## 2015-08-30 DIAGNOSIS — I749 Embolism and thrombosis of unspecified artery: Secondary | ICD-10-CM | POA: Diagnosis not present

## 2015-08-30 DIAGNOSIS — I6349 Cerebral infarction due to embolism of other cerebral artery: Secondary | ICD-10-CM

## 2015-08-30 NOTE — Progress Notes (Signed)
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 Reviewed the images with patient and her daughter and pointed out the abnormalities and bilateral distribution. Recommended starting patient on asa 325 and further workup for etiology which would include TTE/TEE, holter monitor, bascular imaging for etiolgy such as paroxysmal afib which can place patient at high risk for further strokes. Discussed the newer anticoagulants such as Eliquis. They would like to think about whether they will proceed or not.  MRi of the brain: Small focus of restricted diffusion anterior-most insula on the RIGHT consistent with a acute or subacute infarct. Low level subcentimeter sized areas of apparent restriction throughout the BILATERAL RIGHT greater than LEFT subcortical white matter, suspected to represent areas of subacute infarction. Given the different locations, a shower of emboli is possible.  HPI: Rebecca Travis is a 82 y.o. female here as a referral from Dr. Felipa Eth for memory problems. In August she underwent evacuation of a chronic subdural hematoma due to a fall. Since the surgery in August, she has been having memory problems. Mostly since the fall and hematoma. Daughter is with patient and provides most information, daughter says she fell and was having headaches and 8 weeks afterwards she was diagnosed with a hematoma. She had surgery. She was put on memory medication, galantamine, but she had increased fatigue and the medication was stopped. She has also started having tremors. Started in November. Stopping the galantamine did not help the tremors. Memory changes are progressive and worsening  rapidly. She had baseline memory problems before the fall. She would get confused but she was living independently before the fall, she was active and independent, she was [employer]. Memory problems first started at least a few months before the fall, she would forget appointment and times. She would have to write everything down. Tremor in the hands and tremor in the mouth. She never had tremors before the bleed oruntil after the surgery. She had the surgery and did better but has been declining ever since then. She had nightmares on the Aricept. She has depression after all of this. There have been a lot of changes. She is in independent living now. Patient says she feels very sad. She is not happy. The lack of independence is worrisome. She is in a new environment in independent living. She has fatigue, not exercising like she used, she has PT twice a week. She has confusion sometimes, the phone was ringing and she picked up the remote control. Forgetting to turn the alarm system off or on. More short-term meory issues, she gets distracted, difficulty focusing.   Reviewed notes, labs and imaging from outside physicians, which showed:  [facility]: Patient was seen by Dr Kathyrn Sheriff Who performed the evacuation of the chronic subdural hematoma. He states that after that she had improvement in headaches but continues to experience generalized fatigue as well as bilateral tremor which is worse in the morning and continued difficulty with memory. She was taken to the emergency department for these complaints at the end of November when repeat CT scan was done which was essentially normal without evidence of any further left-sided subdural hematoma. There were subsequently seen by their geriatrician Actigraph  galantamine. Fatigue has improved but the rest of the symptoms are unchanged and she was sent here for evaluation.  CT of the head 06/2015: personally  reviewed images and agree with findings:: Findings consistent with status post left craniotomy. Calvarium intact otherwise. Diffuse atrophy with white matter low attenuation. No evidence of acute vascular territory infarct or mass. No hemorrhage or extra-axial fluid.  IMPRESSION: Severe chronic involutional change with no acute findings in this patient with prior subdural hematoma on the left. No evidence of extra-axial fluid collection today.  Review of Systems: Patient complains of symptoms per HPI as well as the following symptoms: fatigue, cough, runny nose. Pertinent negatives per HPI. All others negative.   Social History   Social History  . Marital Status: Divorced    Spouse Name: N/A  . Number of Children: N/A  . Years of Education: N/A   Occupational History  . Retired    Social History Main Topics  . Smoking status: Former Smoker -- 0.15 packs/day    Types: Cigarettes    Quit date: 08/13/1951  . Smokeless tobacco: Never Used  . Alcohol Use: 0.0 oz/week    0 Standard drinks or equivalent per week     Comment: 1 glass wine per week  . Drug Use: No  . Sexual Activity: Not Currently   Other Topics Concern  . Not on file   Social History Narrative   Lives alone   Caffeine use: none     Family History  Problem Relation Age of Onset  . Stroke Father   . Dementia Neg Hx     Past Medical History  Diagnosis Date  . Hypertension   . Hypothyroidism   . GERD (gastroesophageal reflux disease)   . SVD (spontaneous vaginal delivery)     x 3  . Hyperlipidemia     diet controlled - no meds  . Arthritis     shoulder, knee  - otc med prn  . Cancer ([place])   . Subdural hematoma [facility])     Past Surgical History  Procedure Laterality Date  . Total thyroidectomy    . Breast surgery      right breast duct   . Colonoscopy    . Tooth extraction    . Hysteroscopy w/d&c N/A 09/15/2013    Procedure: DILATATION AND CURETTAGE /HYSTEROSCOPY WITH UTERINE POLYP RESECTION;   Surgeon: Eldred Manges, MD;  Location: [facility];  Service: Gynecology;  Laterality: N/A;  . Craniotomy Left 03/25/2015    Procedure: CRANIOTOMY HEMATOMA EVACUATION SUBDURAL;  Surgeon: Consuella Lose, MD;  Location: [facility];  Service: Neurosurgery;  Laterality: Left;    Current Outpatient Prescriptions  Medication Sig Dispense Refill  . acetaminophen (TYLENOL) 500 MG tablet Take 1,000 mg by mouth every 6 (six) hours as needed for moderate pain.    . cycloSPORINE (RESTASIS) 0.05 % ophthalmic emulsion Place 1 drop into both eyes 2 (two) times daily.    Marland Kitchen FLUZONE HIGH-DOSE 0.5 ML SUSY Inject 1 Dose into the muscle once.  0  . lisinopril (PRINIVIL,ZESTRIL) 20 MG tablet Take 20 mg by mouth daily.    . ranitidine (ZANTAC) 150 MG tablet Take 150 mg by mouth 2 (two) times daily.    . rivastigmine (EXELON) 4.6 mg/24hr Place 1 patch (4.6 mg total) onto the skin daily. 30 patch 12  . SYNTHROID 75 MCG tablet Take 75 mcg by mouth daily.    . calcium carbonate (TUMS) 500 MG chewable tablet Chew 2 tablets by mouth  daily as needed.     No current facility-administered medications for this visit.    Allergies as of 08/30/2015 - Review Complete 08/30/2015  Allergen Reaction Noted  . Aricept [donepezil hcl] Other (See Comments) 03/25/2015  . Benzonatate Diarrhea 03/25/2015  . Cefuroxime Diarrhea 03/25/2015  . Donepezil Other (See Comments)   . Fish oil Diarrhea and Other (See Comments) 03/25/2015  . Gabapentin Other (See Comments) 03/25/2015  . Statins Other (See Comments) 03/25/2015  . Ezetimibe Other (See Comments) 07/09/2015  . Loratadine Other (See Comments) 03/25/2015  . Tramadol Other (See Comments) 07/09/2015    Vitals: BP 152/70 mmHg  Pulse 63  Ht 5\' 3"  (1.6 m)  Wt 143 lb 12.8 oz (65.227 kg)  BMI 25.48 kg/m2 Last Weight:  Wt Readings from Last 1 Encounters:  08/30/15 143 lb 12.8 oz (65.227 kg)   Last Height:   Ht Readings from Last 1 Encounters:  08/30/15 5\' 3"  (1.6 m)    Montreal Cognitive Assessment  08/17/2015  Visuospatial/ Executive (0/5) 0  Naming (0/3) 3  Attention: Read list of digits (0/2) 1  Attention: Read list of letters (0/1) 1  Attention: Serial 7 subtraction starting at 100 (0/3) 0  Language: Repeat phrase (0/2) 2  Language : Fluency (0/1) 0  Abstraction (0/2) 2  Delayed Recall (0/5) 0  Orientation (0/6) 1  Total 10  Adjusted Score (based on education) 11     Cranial Nerves:  The pupils are equal, round, and reactive to light. The fundi areflat. Visual fields are full to finger confrontation. Impaired upgaze otherwise extraocular movements are intact. Trigeminal sensation is intact and the muscles of mastication are normal. The face is symmetric. The palate elevates in the midline. Hearing intact. Voice is normal. Shoulder shrug is normal. The tongue has normal motion without fasciculations.   Coordination:  Normal finger to nose and heel to shin.   Gait:  Heel-toe and tandem gait are normal.   Motor Observation:  "No no" fine head tremor  Postural tremor high frequency, low amplitude  Minimal dec. Amplitude but ok finger taps Tone:  Normal muscle tone.   Posture:  Posture is normal. normal erect   Strength:  Strength is V/V in the upper and lower limbs.    Sensation: intact to LT   Reflex Exam:  DTR's:  Deep tendon reflexes in the upper and lower extremities are brisk bilaterally.  Toes:  The toes are upgoing bilaterally.  Clonus:  Clonus is absent.     Assessment/Plan: This is a lovely 82 year old female who had some baseline memory deficits and then fell causing a chronic subdural hematoma with subsequent evacuation. Since then patient's cognition has been further declining. Feel that this is multifactorial, it appears as though patient did have some baseline memory deficits which were worsened by the chronic subdural hematoma however patient is also depressed with  fatigue, there have been a lot of changes in her life including a new living situation and what patient describes as loss of independence in her new living situation. I did have a long discussion with patient and her daughter. Patient may not regain her previous baseline before the chronic subdural hematoma. I do recommend following up with primary care for depression and fatigue. Encouraged patient to be social with in her new living situation and be involved with some of the activities offered her independent living facility. The Montral cognitive assessment score is in the dementia range of 11 out of 30.Will check thyroid for tremor, B12,  rpr, cmp and cbc for fatigue and memory problems. Will try Rivastigmine patch and increase as tolerated, then consider Namenda.    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   - Recommended starting patient on asa 325 and further workup for etiology which would include TTE/TEE, holter monitor, vascular imaging for etiolgy such as paroxysmal afib which can place patient at high risk for further strokes. Discussed the newer anticoagulants such as Eliquis. They would like to think about whether they will proceed or not.    Sarina Ill, MD  [facility] [address] [place], [place] [address]  Phone [phone] Fax [phone]  A total of 30 minutes was spent face-to-face with this patient. Over half this time was spent on counseling patient on the dementia and embolic ischemic infarts diagnosis and different diagnostic and therapeutic options available.

## 2015-08-31 ENCOUNTER — Telehealth: Payer: Self-pay | Admitting: Neurology

## 2015-08-31 DIAGNOSIS — I6340 Cerebral infarction due to embolism of unspecified cerebral artery: Secondary | ICD-10-CM | POA: Insufficient documentation

## 2015-08-31 NOTE — Telephone Encounter (Signed)
We have no way to know if her memory will improve, will have to watch her clinically. thanks

## 2015-08-31 NOTE — Telephone Encounter (Signed)
Called home phone by mistake; did not leave message. Spoke with Rebecca Travis, her daughter who stated that she did not discuss her mother's short term memory loss since her "mini stroke". Daughter stated she wants to know if this will be her mother's norm/be permanent or will she improve. Informed her that her mother may not improve but would route her question to Dr Jaynee Eagles for her reply. Inquired as to her mother's living situation; daughter stated she is in independent living, has her medications managed and meals delivered. Daughter stated "I feel she's in a safe environment." Informed [name] that Dr Jaynee Eagles is off tomorrow but she will receive call back as soon as possible. Daughter verbalizd understanding, appreciation for call.

## 2015-08-31 NOTE — Telephone Encounter (Signed)
Patient's daughter is calling. The patient was seen yesterday and her daughter has some additional questions after the consultation. Thank you.

## 2015-08-31 NOTE — Telephone Encounter (Signed)
Spoke with Joseph Art and informed her of Dr Cathren Laine reply. Advised that her mother can be seen earlier if she or other family members have concern related to patient's memory. She verbalized understanding, appreciation for call back.

## 2015-09-06 ENCOUNTER — Telehealth: Payer: Self-pay | Admitting: Neurology

## 2015-09-06 NOTE — Telephone Encounter (Signed)
Pt called said she would like to proceed with heart monitor and to please call Rebecca Travis (daughter)at [phone]. Pt is requesting to speak with Dr Jaynee Eagles not RN

## 2015-09-07 ENCOUNTER — Other Ambulatory Visit: Payer: Self-pay | Admitting: Neurology

## 2015-09-07 DIAGNOSIS — I6349 Cerebral infarction due to embolism of other cerebral artery: Secondary | ICD-10-CM

## 2015-09-07 NOTE — Telephone Encounter (Signed)
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  Terrence Dupont, can you cancel her eeg? She doesn't need it thanks

## 2015-09-07 NOTE — Telephone Encounter (Signed)
Cancelled EEG per Dr Jaynee Eagles request.

## 2015-09-13 ENCOUNTER — Other Ambulatory Visit: Payer: Self-pay | Admitting: *Deleted

## 2015-09-13 DIAGNOSIS — I6349 Cerebral infarction due to embolism of other cerebral artery: Secondary | ICD-10-CM

## 2015-09-14 ENCOUNTER — Other Ambulatory Visit: Payer: Medicare Other

## 2015-09-19 ENCOUNTER — Other Ambulatory Visit: Payer: Self-pay | Admitting: Neurology

## 2015-09-19 DIAGNOSIS — I6349 Cerebral infarction due to embolism of other cerebral artery: Secondary | ICD-10-CM

## 2015-09-19 DIAGNOSIS — I4891 Unspecified atrial fibrillation: Secondary | ICD-10-CM

## 2015-09-19 DIAGNOSIS — I639 Cerebral infarction, unspecified: Secondary | ICD-10-CM

## 2015-09-20 ENCOUNTER — Ambulatory Visit (HOSPITAL_COMMUNITY): Payer: Medicare Other

## 2015-10-02 ENCOUNTER — Ambulatory Visit (HOSPITAL_COMMUNITY)
Admission: RE | Admit: 2015-10-02 | Discharge: 2015-10-02 | Disposition: A | Discharge status: Home / Self Care | Payer: Medicare Other | Source: Ambulatory Visit | Attending: Neurology | Admitting: Neurology

## 2015-10-02 DIAGNOSIS — I6349 Cerebral infarction due to embolism of other cerebral artery: Secondary | ICD-10-CM | POA: Diagnosis not present

## 2015-10-02 DIAGNOSIS — E785 Hyperlipidemia, unspecified: Secondary | ICD-10-CM | POA: Diagnosis not present

## 2015-10-02 DIAGNOSIS — I10 Essential (primary) hypertension: Secondary | ICD-10-CM | POA: Diagnosis not present

## 2015-10-02 NOTE — Progress Notes (Signed)
*  PRELIMINARY RESULTS* Vascular Ultrasound Carotid Duplex (Doppler) has been completed.  Preliminary findings: Bilateral: No significant (1-39%) ICA stenosis. Antegrade vertebral flow.   Landry Mellow, RDMS, RVT  10/02/2015, 2:43 PM

## 2015-10-04 ENCOUNTER — Telehealth: Payer: Self-pay | Admitting: *Deleted

## 2015-10-04 NOTE — Telephone Encounter (Signed)
Called and spoke to daughter, Joseph Art. Advised US carotid normal, no sig stenosis (narrowing of arteries) per Dr Jaynee Eagles. She also would like OV notes, results forwarded to Dr Felipa Eth. I advised I will fax this for her. She verbalized understanding.   Faxed notes/results to Dr Felipa Eth. [phone]. Received confirmation.

## 2015-10-04 NOTE — Telephone Encounter (Signed)
-----   Message from Melvenia Beam, MD sent at 10/04/2015  7:58 AM EST ----- Carotids are normal, no significant stenosis thanks

## 2015-10-09 ENCOUNTER — Ambulatory Visit (INDEPENDENT_AMBULATORY_CARE_PROVIDER_SITE_OTHER): Payer: Medicare Other

## 2015-10-09 ENCOUNTER — Other Ambulatory Visit: Payer: Self-pay

## 2015-10-09 ENCOUNTER — Ambulatory Visit (HOSPITAL_COMMUNITY): Payer: Medicare Other | Attending: Cardiology

## 2015-10-09 DIAGNOSIS — E785 Hyperlipidemia, unspecified: Secondary | ICD-10-CM | POA: Insufficient documentation

## 2015-10-09 DIAGNOSIS — I6349 Cerebral infarction due to embolism of other cerebral artery: Secondary | ICD-10-CM | POA: Diagnosis not present

## 2015-10-09 DIAGNOSIS — I119 Hypertensive heart disease without heart failure: Secondary | ICD-10-CM | POA: Diagnosis not present

## 2015-10-09 DIAGNOSIS — Z87891 Personal history of nicotine dependence: Secondary | ICD-10-CM | POA: Diagnosis not present

## 2015-10-09 DIAGNOSIS — I639 Cerebral infarction, unspecified: Secondary | ICD-10-CM

## 2015-10-09 DIAGNOSIS — I4891 Unspecified atrial fibrillation: Secondary | ICD-10-CM | POA: Insufficient documentation

## 2015-10-09 DIAGNOSIS — I340 Nonrheumatic mitral (valve) insufficiency: Secondary | ICD-10-CM | POA: Insufficient documentation

## 2015-11-28 ENCOUNTER — Ambulatory Visit (INDEPENDENT_AMBULATORY_CARE_PROVIDER_SITE_OTHER): Payer: Medicare Other | Admitting: Neurology

## 2015-11-28 ENCOUNTER — Telehealth: Payer: Self-pay | Admitting: Neurology

## 2015-11-28 ENCOUNTER — Encounter: Payer: Self-pay | Admitting: Neurology

## 2015-11-28 VITALS — BP 130/80 | HR 61 | Wt 143.4 lb

## 2015-11-28 DIAGNOSIS — I669 Occlusion and stenosis of unspecified cerebral artery: Secondary | ICD-10-CM

## 2015-11-28 DIAGNOSIS — Z9181 History of falling: Secondary | ICD-10-CM

## 2015-11-28 DIAGNOSIS — R269 Unspecified abnormalities of gait and mobility: Secondary | ICD-10-CM | POA: Diagnosis not present

## 2015-11-28 DIAGNOSIS — R2689 Other abnormalities of gait and mobility: Secondary | ICD-10-CM | POA: Diagnosis not present

## 2015-11-28 DIAGNOSIS — F0390 Unspecified dementia without behavioral disturbance: Secondary | ICD-10-CM | POA: Diagnosis not present

## 2015-11-28 DIAGNOSIS — R531 Weakness: Secondary | ICD-10-CM

## 2015-11-28 MED ORDER — MEMANTINE HCL 28 X 5 MG & 21 X 10 MG PO TABS: ORAL_TABLET | ORAL | Status: DC

## 2015-11-28 MED ORDER — MEMANTINE HCL 10 MG PO TABS: 10.0000 mg | ORAL_TABLET | Freq: Two times a day (BID) | ORAL | Status: DC

## 2015-11-28 NOTE — Telephone Encounter (Signed)
Rebecca Travis, can you please contact [facility] for for in-home physical therapy [facility]. She has gait abnormality, strokes. Near falls. Balance problems. Fall risk. Weakness. Need Pt for gait and safety, balance, strengthening, evaluation for fall risk.

## 2015-11-28 NOTE — Patient Instructions (Addendum)
Remember to drink plenty of fluid, eat healthy meals and do not skip any meals. Try to eat protein with a every meal and eat a healthy snack such as fruit or nuts in between meals. Try to keep a regular sleep-wake schedule and try to exercise daily, particularly in the form of walking, 20-30 minutes a day, if you can.   As far as your medications are concerned, I would like to suggest Continue current medications Start namenda [facility]) as discussed.  Physical therapy  I would like to see you back in 6 months, sooner if we need to. Please call us with any interim questions, concerns, problems, updates or refill requests.   Our phone number is [phone]. We also have an after hours call service for urgent matters and there is a physician on-call for urgent questions. For any emergencies you know to call 911 or go to the nearest emergency room

## 2015-11-28 NOTE — Progress Notes (Addendum)
[facility]    Provider:  Dr Jaynee Eagles Referring Provider: Lajean Manes, MD Primary Care Physician:  Mathews Argyle, MD  CC: Memory loss  Interval history 11/28/2015: Rebecca Travis is a 82 y.o. female here as a referral from Dr. Felipa Eth for memory problems. In August she underwent evacuation of a chronic subdural hematoma due to a fall. Since the surgery in August, she has been having memory problems. Mostly since the fall and hematoma. She is not having any side effects from the Exelon patch. Memory is stable. Discussed findings from carotid Dopplers and a 30 day cardiac monitor. Carotid Dopplers did not show any hemodynamically significant stenosis. The cardiac monitor did not show atrial fibrillation or atrial flutter. Echocardiogram showed mild left ventricular hypertrophy with grade 1 diastolic dysfunction Discussed the next steps of a workup including transesophageal echocardiogram and a loop recorder but patient and daughter declined. Did discuss that the patient has been treated were discovered atrial fibrillation she is at increased risks for stroke and they understand this. Discussed staying on ASA 325 and this is the only intervention/treatment that they feel comfortable with. She is feeling weaker, more difficulty ambulating, fatigue, not exercising, Sitting in a chair all day, near falls, stumbling, decreased balance. No swallowing difficulty. No depression or mood issues. Sleeping well and eating well. Feel as though patient could benefit from physical therapy, will ask [facility] for in home therapy.   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 Reviewed the images with patient and her daughter and pointed out the abnormalities and bilateral distribution. Recommended starting patient on asa 325 and further  workup for etiology which would include TTE/TEE, holter monitor, bascular imaging for etiolgy such as paroxysmal afib which can place patient at high risk for further strokes. Discussed the newer anticoagulants such as Eliquis. They would like to think about whether they will proceed or not.  MRi of the brain: Small focus of restricted diffusion anterior-most insula on the RIGHT consistent with a acute or subacute infarct. Low level subcentimeter sized areas of apparent restriction throughout the BILATERAL RIGHT greater than LEFT subcortical white matter, suspected to represent areas of subacute infarction. Given the different locations, a shower of emboli is possible.  HPI: Rebecca Travis is a 82 y.o. female here as a referral from Dr. Felipa Eth for memory problems. In August she underwent evacuation of a chronic subdural hematoma due to a fall. Since the surgery in August, she has been having memory problems. Mostly since the fall and hematoma. Daughter is with patient and provides most information, daughter says she fell and was having headaches and 8 weeks afterwards she was diagnosed with a hematoma. She had surgery. She was put on memory medication, galantamine, but she had increased fatigue and the medication was stopped. She has also started having tremors. Started in November. Stopping the galantamine did not help the tremors. Memory changes are progressive and worsening rapidly. She had baseline memory problems before the fall. She would get confused but she was living independently before the fall, she was active and independent, she was [employer]. Memory problems first started at least a few months before the fall, she would forget appointment and times. She would have to write everything down. Tremor in the hands and tremor in the mouth. She never had tremors before the bleed oruntil after the surgery. She had  the surgery and did better but has been declining ever since then. She had  nightmares on the Aricept. She has depression after all of this. There have been a lot of changes. She is in independent living now. Patient says she feels very sad. She is not happy. The lack of independence is worrisome. She is in a new environment in independent living. She has fatigue, not exercising like she used, she has PT twice a week. She has confusion sometimes, the phone was ringing and she picked up the remote control. Forgetting to turn the alarm system off or on. More short-term meory issues, she gets distracted, difficulty focusing.   Reviewed notes, labs and imaging from outside physicians, which showed:  [facility]: Patient was seen by Dr Kathyrn Sheriff Who performed the evacuation of the chronic subdural hematoma. He states that after that she had improvement in headaches but continues to experience generalized fatigue as well as bilateral tremor which is worse in the morning and continued difficulty with memory. She was taken to the emergency department for these complaints at the end of November when repeat CT scan was done which was essentially normal without evidence of any further left-sided subdural hematoma. There were subsequently seen by their geriatrician Actigraph galantamine. Fatigue has improved but the rest of the symptoms are unchanged and she was sent here for evaluation.  CT of the head 06/2015: personally reviewed images and agree with findings:: Findings consistent with status post left craniotomy. Calvarium intact otherwise. Diffuse atrophy with white matter low attenuation. No evidence of acute vascular territory infarct or mass. No hemorrhage or extra-axial fluid.  IMPRESSION: Severe chronic involutional change with no acute findings in this patient with prior subdural hematoma on the left. No evidence of extra-axial fluid collection today.  Review of Systems: Patient complains of symptoms per HPI as well as the following  symptoms: fatigue, cough, runny nose. Pertinent negatives per HPI. All others negative.    Social History   Social History  . Marital Status: Divorced    Spouse Name: N/A  . Number of Children: N/A  . Years of Education: N/A   Occupational History  . Retired    Social History Main Topics  . Smoking status: Former Smoker -- 0.15 packs/day    Types: Cigarettes    Quit date: 08/13/1951  . Smokeless tobacco: Never Used  . Alcohol Use: 0.0 oz/week    0 Standard drinks or equivalent per week     Comment: 1 glass wine per week  . Drug Use: No  . Sexual Activity: Not Currently   Other Topics Concern  . Not on file   Social History Narrative   Lives alone   Caffeine use: none     Family History  Problem Relation Age of Onset  . Stroke Father   . Dementia Neg Hx     Past Medical History  Diagnosis Date  . Hypertension   . Hypothyroidism   . GERD (gastroesophageal reflux disease)   . SVD (spontaneous vaginal delivery)     x 3  . Hyperlipidemia     diet controlled - no meds  . Arthritis     shoulder, knee  - otc med prn  . Cancer ([place])   . Subdural hematoma [facility])     Past Surgical History  Procedure Laterality Date  . Total thyroidectomy    . Breast surgery      right breast duct   . Colonoscopy    .  Tooth extraction    . Hysteroscopy w/d&c N/A 09/15/2013    Procedure: DILATATION AND CURETTAGE /HYSTEROSCOPY WITH UTERINE POLYP RESECTION;  Surgeon: Eldred Manges, MD;  Location: [facility];  Service: Gynecology;  Laterality: N/A;  . Craniotomy Left 03/25/2015    Procedure: CRANIOTOMY HEMATOMA EVACUATION SUBDURAL;  Surgeon: Consuella Lose, MD;  Location: [facility];  Service: Neurosurgery;  Laterality: Left;    Current Outpatient Prescriptions  Medication Sig Dispense Refill  . acetaminophen (TYLENOL) 500 MG tablet Take 1,000 mg by mouth every 6 (six) hours as needed for moderate pain.    Marland Kitchen aspirin 325 MG tablet Take 325 mg by mouth daily.    . calcium  carbonate (TUMS) 500 MG chewable tablet Chew 2 tablets by mouth daily as needed.    . cycloSPORINE (RESTASIS) 0.05 % ophthalmic emulsion Place 1 drop into both eyes 2 (two) times daily.    Marland Kitchen FLUZONE HIGH-DOSE 0.5 ML SUSY Inject 1 Dose into the muscle once.  0  . lisinopril (PRINIVIL,ZESTRIL) 20 MG tablet Take 20 mg by mouth daily.    . ranitidine (ZANTAC) 150 MG tablet Take 150 mg by mouth 2 (two) times daily.    . rivastigmine (EXELON) 4.6 mg/24hr Place 1 patch (4.6 mg total) onto the skin daily. 30 patch 12  . SYNTHROID 75 MCG tablet Take 75 mcg by mouth daily.    Marland Kitchen amLODipine (NORVASC) 2.5 MG tablet Take 2.5 mg by mouth daily.  12  . memantine (NAMENDA TITRATION PAK) tablet pack 5 mg/day for =1 week; 5 mg twice daily for =1 week; 15 mg/day given in 5 mg and 10 mg separated doses for =1 week; then 10 mg twice daily 49 tablet 0  . memantine (NAMENDA) 10 MG tablet Take 1 tablet (10 mg total) by mouth 2 (two) times daily. 60 tablet 12   No current facility-administered medications for this visit.    Allergies as of 11/28/2015 - Review Complete 11/28/2015  Allergen Reaction Noted  . Aricept [donepezil hcl] Other (See Comments) 03/25/2015  . Benzonatate Diarrhea 03/25/2015  . Cefuroxime Diarrhea 03/25/2015  . Donepezil Other (See Comments)   . Fish oil Diarrhea and Other (See Comments) 03/25/2015  . Gabapentin Other (See Comments) 03/25/2015  . Statins Other (See Comments) 03/25/2015  . Ezetimibe Other (See Comments) 07/09/2015  . Loratadine Other (See Comments) 03/25/2015  . Tramadol Other (See Comments) 07/09/2015    Vitals: BP 130/80 mmHg  Pulse 61  Wt 143 lb 6.4 oz (65.046 kg)  SpO2 97% Last Weight:  Wt Readings from Last 1 Encounters:  11/28/15 143 lb 6.4 oz (65.046 kg)   Last Height:   Ht Readings from Last 1 Encounters:  08/30/15 5\' 3"  (1.6 m)   Montreal Cognitive Assessment  08/17/2015  Visuospatial/ Executive (0/5) 0  Naming (0/3) 3  Attention: Read list of digits  (0/2) 1  Attention: Read list of letters (0/1) 1  Attention: Serial 7 subtraction starting at 100 (0/3) 0  Language: Repeat phrase (0/2) 2  Language : Fluency (0/1) 0  Abstraction (0/2) 2  Delayed Recall (0/5) 0  Orientation (0/6) 1  Total 10  Adjusted Score (based on education) 11     Cranial Nerves:  The pupils are equal, round, and reactive to light. Visual fields are full to finger confrontation. Impaired upgaze otherwise extraocular movements are intact. Trigeminal sensation is intact and the muscles of mastication are normal. The face is symmetric. The palate elevates in the midline. Hearing intact. Voice is  normal. Shoulder shrug is normal. The tongue has normal motion without fasciculations.   Coordination:  Normal finger to nose and heel to shin.   Gait:  difficulty getting up from seat. Imabalance. Difficulty with tandem gait.   Motor Observation:  "No no" fine head tremor  Postural tremor high frequency, low amplitude  Minimal dec. Amplitude but ok finger taps Tone:  Normal muscle tone.   Posture:  Posture is normal. normal erect   Strength: Proximal weakness legs > arms.     Sensation: intact to LT   Reflex Exam:  DTR's:  Deep tendon reflexes in the upper and lower extremities are brisk bilaterally.  Toes:  The toes are upgoing bilaterally.  Clonus:  Clonus is absent.     Assessment/Plan: This is a lovely 82 year old female who had some baseline memory deficits and then fell causing a chronic subdural hematoma with subsequent evacuation. Since then patient's cognition has been further declining. Feel that this is multifactorial, it appears as though patient did have some baseline memory deficits which were worsened by the chronic subdural hematoma however patient is also depressed with fatigue, there have been a lot of changes in her life including a new living situation and what patient describes as loss of  independence in her new living situation. I did have a long discussion with patient and her daughter. Patient may not regain her previous baseline before the chronic subdural hematoma. I do recommend following up with primary care for depression and fatigue. Encouraged patient to be social with in her new living situation and be involved with some of the activities offered her independent living facility. The Montral cognitive assessment score is in the dementia range of 11 out of 30.Will check thyroid for tremor, B12, rpr, cmp and cbc for fatigue and memory problems, all of which were unremarkable . Will try Rivastigmine patch and increase as tolerated, then consider Namenda.   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   - Recommended continuing patient on asa 325 and further workup for etiology which would include TTE/TEE, holter monitor, vascular imaging for etiolgy such as paroxysmal afib which can place patient at high risk for further strokes. Discussed the newer anticoagulants such as Eliquis. Patient is on aspirin 325. Echocardiogram showed mild left ventricular hypertrophy with grade 1 diastolic dysfunction ( asked them to follow-up with primary care regarding this).  The cardiac monitor did not show atrial fibrillation or atrial flutter.  Discussed the next steps of a workup including transesophageal echocardiogram and a loop recorder but patient and daughter declined.  Discussed staying on ASA 325 and this is the only intervention/treatment that they feel comfortable with and do not wish to further pursue evaluation for atrial fibrillation despite increased risk of stroke and are not interested in medication such as request.  -Will refer to [facility] for in-home physical therapy at [facility]. She has gait abnormality, strokes. Near falls. Balance problems. Fall risk. Weakness. Need Pt for gait and safety,  balance, strengthening, evaluation for fall risk.     Sarina Ill, MD  [facility] [address] [place], [place] [address]  Phone [phone] Fax [phone]  A total of 30 minutes was spent face-to-face with this patient. Over half this time was spent on counseling patient on the dementia and embolic ischemic infarts diagnosis and gait abnormality and weakness and different diagnostic and therapeutic options available.

## 2015-11-29 NOTE — Telephone Encounter (Signed)
Placed referral to [facility] for in-home PT at pt facility per Dr Jaynee Eagles request. Sent message VIA Epic to Miguel Dibble, RN [facility] Transitional care manager)  that referral was placed.

## 2015-12-01 ENCOUNTER — Telehealth: Payer: Self-pay | Admitting: Neurology

## 2015-12-01 NOTE — Telephone Encounter (Signed)
FYI

## 2015-12-01 NOTE — Telephone Encounter (Signed)
Called and gave verbal order for speech therapy consult for cognitive training. Okay per Dr Jaynee Eagles. Erlene Quan states he can take the verbal. Does not need anything further. Told him to call back with any further questions/concerns. He verbalized understanding.

## 2015-12-01 NOTE — Telephone Encounter (Signed)
Brandon[facility] [phone] called to request order for speech therapy consult for cognitive training.

## 2015-12-02 NOTE — Telephone Encounter (Signed)
thanks

## 2015-12-14 ENCOUNTER — Encounter: Payer: Self-pay | Admitting: *Deleted

## 2015-12-14 NOTE — Progress Notes (Signed)
Faxed signed order for ST eval by Dr Jaynee Eagles to [facility]. Order date: 12/03/15. Fax: [phone]. Received confirmation.

## 2016-01-01 ENCOUNTER — Telehealth: Payer: Self-pay | Admitting: *Deleted

## 2016-01-01 NOTE — Telephone Encounter (Signed)
LVM for daughter, Joseph Art about results per Dr Jaynee Eagles note. Gave [facility] Phone number if she has further questions.

## 2016-01-01 NOTE — Telephone Encounter (Signed)
-----   Message from Melvenia Beam, MD sent at 12/31/2015  9:15 AM EDT ----- No atrial fibrillation seen thanks

## 2016-04-27 ENCOUNTER — Emergency Department (HOSPITAL_COMMUNITY)
Admission: EM | Admit: 2016-04-27 | Discharge: 2016-04-28 | Disposition: A | Discharge status: Home / Self Care | Payer: Medicare Other | Attending: Emergency Medicine | Admitting: Emergency Medicine

## 2016-04-27 ENCOUNTER — Encounter (HOSPITAL_COMMUNITY): Payer: Self-pay

## 2016-04-27 ENCOUNTER — Emergency Department (HOSPITAL_COMMUNITY): Payer: Medicare Other

## 2016-04-27 DIAGNOSIS — E860 Dehydration: Secondary | ICD-10-CM | POA: Diagnosis not present

## 2016-04-27 DIAGNOSIS — Z7982 Long term (current) use of aspirin: Secondary | ICD-10-CM | POA: Insufficient documentation

## 2016-04-27 DIAGNOSIS — R410 Disorientation, unspecified: Secondary | ICD-10-CM | POA: Diagnosis not present

## 2016-04-27 DIAGNOSIS — I951 Orthostatic hypotension: Secondary | ICD-10-CM | POA: Diagnosis not present

## 2016-04-27 DIAGNOSIS — Z8679 Personal history of other diseases of the circulatory system: Secondary | ICD-10-CM | POA: Insufficient documentation

## 2016-04-27 DIAGNOSIS — E039 Hypothyroidism, unspecified: Secondary | ICD-10-CM | POA: Diagnosis not present

## 2016-04-27 DIAGNOSIS — Z79899 Other long term (current) drug therapy: Secondary | ICD-10-CM | POA: Insufficient documentation

## 2016-04-27 DIAGNOSIS — Z859 Personal history of malignant neoplasm, unspecified: Secondary | ICD-10-CM | POA: Diagnosis not present

## 2016-04-27 DIAGNOSIS — R531 Weakness: Secondary | ICD-10-CM

## 2016-04-27 DIAGNOSIS — Z87891 Personal history of nicotine dependence: Secondary | ICD-10-CM | POA: Insufficient documentation

## 2016-04-27 LAB — BASIC METABOLIC PANEL
ANION GAP: 11 (ref 5–15)
BUN: 12 mg/dL (ref 6–20)
CALCIUM: 9.7 mg/dL (ref 8.9–10.3)
CHLORIDE: 108 mmol/L (ref 101–111)
CO2: 22 mmol/L (ref 22–32)
Creatinine, Ser: 1.14 mg/dL — ABNORMAL HIGH (ref 0.44–1.00)
GFR calc non Af Amer: 41 mL/min — ABNORMAL LOW (ref >60)
GFR, EST AFRICAN AMERICAN: 48 mL/min — AB (ref >60)
GLUCOSE: 153 mg/dL — AB (ref 65–99)
Potassium: 3.6 mmol/L (ref 3.5–5.1)
Sodium: 141 mmol/L (ref 135–145)

## 2016-04-27 LAB — CBC
HEMATOCRIT: 48.3 % — AB (ref 36.0–46.0)
HEMOGLOBIN: 15.7 g/dL — AB (ref 12.0–15.0)
MCH: 29.4 pg (ref 26.0–34.0)
MCHC: 32.5 g/dL (ref 30.0–36.0)
MCV: 90.4 fL (ref 78.0–100.0)
Platelets: 277 10*3/uL (ref 150–400)
RBC: 5.34 MIL/uL — AB (ref 3.87–5.11)
RDW: 13.7 % (ref 11.5–15.5)
WBC: 13.3 10*3/uL — ABNORMAL HIGH (ref 4.0–10.5)

## 2016-04-27 LAB — DIFFERENTIAL
Basophils Absolute: 0 10*3/uL (ref 0.0–0.1)
Basophils Relative: 0 %
EOS ABS: 0 10*3/uL (ref 0.0–0.7)
EOS PCT: 0 %
LYMPHS ABS: 1.5 10*3/uL (ref 0.7–4.0)
LYMPHS PCT: 12 %
MONO ABS: 0.5 10*3/uL (ref 0.1–1.0)
MONOS PCT: 4 %
Neutro Abs: 10.8 10*3/uL — ABNORMAL HIGH (ref 1.7–7.7)
Neutrophils Relative %: 84 %

## 2016-04-27 LAB — HEPATIC FUNCTION PANEL
ALBUMIN: 3.8 g/dL (ref 3.5–5.0)
ALK PHOS: 120 U/L (ref 38–126)
ALT: 10 U/L — ABNORMAL LOW (ref 14–54)
AST: 18 U/L (ref 15–41)
BILIRUBIN TOTAL: 0.7 mg/dL (ref 0.3–1.2)
Total Protein: 6.8 g/dL (ref 6.5–8.1)

## 2016-04-27 NOTE — ED Notes (Signed)
Patient transported to X-ray 

## 2016-04-27 NOTE — ED Provider Notes (Signed)
[facility] DEPT Provider Note   CSN: [id number] Arrival date & time: 04/27/16  2051  By signing my name below, I, Rebecca Travis, attest that this documentation has been prepared under the direction and in the presence of Delora Fuel, MD. Electronically Signed: Reola Travis, ED Scribe. 04/27/16. 11:21 AM.  History   Chief Complaint Chief Complaint  Patient presents with  . Weakness   The history is provided by the patient, the EMS personnel and a relative. No language interpreter was used.   HPI Comments: Rebecca Travis is a 82 y.o. female BIB [facility], with a PMHx of cancer, TIAs, and subdural hematoma, who presents to the Emergency Department moderate weakness and mild confusion onset earlier in the evening PTA. Her family at bedside reports that the pt called them earlier in the day, and upon arrival to the pt's house, her relative states that pt was notably lethargic and fatigued from her baseline, and was diffusely weak throughout. She reports that the pt was ambulating; however, upon sitting down she was unable to stand back up without assistance. Last known normal: ~11 hours ago. Pt currently lives alone in an independent living facility. No recent falls or trauma. Pt denies CP, chest tightness, nausea, vomiting, difficulty urinating, dysuria, hematuria, frequency, urgency, cough, abdominal pain, or any other associated symptoms.   PCP: Mathews Argyle, MD  Past Medical History:  Diagnosis Date  . Arthritis    shoulder, knee  - otc med prn  . Cancer ([place])   . GERD (gastroesophageal reflux disease)   . Hyperlipidemia    diet controlled - no meds  . Hypertension   . Hypothyroidism   . Subdural hematoma ([place])   . SVD (spontaneous vaginal delivery)    x 3   Patient Active Problem List   Diagnosis Date Noted  . Risk for falls 11/28/2015  . 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 Dementia 08/21/2015  . Tremor 08/21/2015  . Subdural hematoma  ([place]) 03/25/2015  . Endometrial mass 09/15/2013   Past Surgical History:  Procedure Laterality Date  . BREAST SURGERY     right breast duct   . COLONOSCOPY    . CRANIOTOMY Left 03/25/2015   Procedure: CRANIOTOMY HEMATOMA EVACUATION SUBDURAL;  Surgeon: Consuella Lose, MD;  Location: [facility];  Service: Neurosurgery;  Laterality: Left;  . HYSTEROSCOPY W/D&C N/A 09/15/2013   Procedure: DILATATION AND CURETTAGE /HYSTEROSCOPY WITH UTERINE POLYP RESECTION;  Surgeon: Eldred Manges, MD;  Location: [facility];  Service: Gynecology;  Laterality: N/A;  . TOOTH EXTRACTION    . TOTAL THYROIDECTOMY     OB History    No data available     Home Medications    Prior to Admission medications   Medication Sig Start Date End Date Taking? Authorizing Provider  acetaminophen (TYLENOL) 500 MG tablet Take 1,000 mg by mouth every 6 (six) hours as needed for moderate pain.   Yes Historical Provider, MD  amLODipine (NORVASC) 2.5 MG tablet Take 2.5 mg by mouth daily. 10/29/15  Yes Historical Provider, MD  aspirin 325 MG tablet Take 325 mg by mouth daily.   Yes Historical Provider, MD  cycloSPORINE (RESTASIS) 0.05 % ophthalmic emulsion Place 1 drop into both eyes 2 (two) times daily.   Yes Historical Provider, MD  lisinopril (PRINIVIL,ZESTRIL) 20 MG tablet Take 20 mg by mouth daily.    Yes Historical Provider, MD  memantine (NAMENDA) 10 MG tablet Take 1 tablet (10 mg total) by mouth 2 (two) times daily.  11/28/15  Yes Melvenia Beam, MD  Multiple Vitamin (MULTIVITAMIN) tablet Take 1 tablet by mouth daily.   Yes Historical Provider, MD  ranitidine (ZANTAC) 150 MG tablet Take 150 mg by mouth 2 (two) times daily. 02/06/15 04/27/16 Yes Historical Provider, MD  rivastigmine (EXELON) 4.6 mg/24hr Place 1 patch (4.6 mg total) onto the skin daily. 08/17/15  Yes Melvenia Beam, MD  SYNTHROID 75 MCG tablet Take 75 mcg by mouth daily. 01/30/15  Yes Historical Provider, MD   Family History Family History  Problem Relation  Age of Onset  . Stroke Father   . Dementia Neg Hx    Social History Social History  Substance Use Topics  . Smoking status: Former Smoker    Packs/day: 0.15    Types: Cigarettes    Quit date: 08/13/1951  . Smokeless tobacco: Never Used  . Alcohol use 0.0 oz/week     Comment: 1 glass wine per week   Allergies   Aricept [donepezil hcl]; Benzonatate; Cefuroxime; Donepezil; Fish oil; Gabapentin; Statins; Ezetimibe; Loratadine; and Tramadol Review of Systems Review of Systems  Constitutional: Positive for fatigue.  Respiratory: Negative for chest tightness.   Cardiovascular: Negative for chest pain.  Gastrointestinal: Negative for abdominal pain, nausea and vomiting.  Genitourinary: Negative for difficulty urinating, dysuria, frequency, hematuria and urgency.  Neurological: Positive for weakness.  Psychiatric/Behavioral: Positive for confusion.  All other systems reviewed and are negative.  Physical Exam Updated Vital Signs BP 91/71 (BP Location: Left Arm)   Pulse 103   Temp 98.7 F (37.1 C) (Oral)   Resp 21   Ht 5\' 5"  (1.651 m)   Wt 125 lb (56.7 kg)   SpO2 100%   BMI 20.80 kg/m   Physical Exam  Constitutional: She is oriented to person, place, and time. She appears well-developed and well-nourished.  HENT:  Head: Normocephalic and atraumatic.  Eyes: EOM are normal. Pupils are equal, round, and reactive to light.  Neck: Normal range of motion. Neck supple. No JVD present.  Cardiovascular: Normal rate, regular rhythm and normal heart sounds.   No murmur heard. Pulmonary/Chest: Effort normal and breath sounds normal. She has no wheezes. She has no rales. She exhibits no tenderness.  Abdominal: Soft. Bowel sounds are normal. She exhibits no distension and no mass. There is no tenderness.  Musculoskeletal: Normal range of motion. She exhibits no edema.  Lymphadenopathy:    She has no cervical adenopathy.  Neurological: She is alert and oriented to person, place, and time.  No cranial nerve deficit. She exhibits normal muscle tone. Coordination normal.  Skin: Skin is warm and dry. No rash noted.  Psychiatric: She has a normal mood and affect. Her behavior is normal. Judgment and thought content normal.  Nursing note and vitals reviewed.  ED Treatments / Results  DIAGNOSTIC STUDIES: Oxygen Saturation is 95% on RA, adequate by my interpretation.   COORDINATION OF CARE: 11:21 PM-Discussed next steps with pt. Pt verbalized understanding and is agreeable with the plan.   Labs (all labs ordered are listed, but only abnormal results are displayed) Labs Reviewed  BASIC METABOLIC PANEL - Abnormal; Notable for the following:       Result Value   Glucose, Bld 153 (*)    Creatinine, Ser 1.14 (*)    GFR calc non Af Amer 41 (*)    GFR calc Af Amer 48 (*)    All other components within normal limits  CBC - Abnormal; Notable for the following:    WBC 13.3 (*)  RBC 5.34 (*)    Hemoglobin 15.7 (*)    HCT 48.3 (*)    All other components within normal limits  URINALYSIS, ROUTINE W REFLEX MICROSCOPIC (NOT AT [facility]) - Abnormal; Notable for the following:    APPearance CLOUDY (*)    pH 8.5 (*)    Hgb urine dipstick MODERATE (*)    Protein, ur 30 (*)    Leukocytes, UA LARGE (*)    All other components within normal limits  DIFFERENTIAL - Abnormal; Notable for the following:    Neutro Abs 10.8 (*)    All other components within normal limits  HEPATIC FUNCTION PANEL - Abnormal; Notable for the following:    ALT 10 (*)    Bilirubin, Direct <0.1 (*)    All other components within normal limits  URINE MICROSCOPIC-ADD ON - Abnormal; Notable for the following:    Squamous Epithelial / LPF 0-5 (*)    Bacteria, UA FEW (*)    All other components within normal limits  CBG MONITORING, ED - Abnormal; Notable for the following:    Glucose-Capillary 174 (*)    All other components within normal limits    EKG  EKG Interpretation  Date/Time:  Saturday April 27 2016 21:00:04 EDT Ventricular Rate:  85 PR Interval:    QRS Duration: 103 QT Interval:  370 QTC Calculation: 440 R Axis:   14 Text Interpretation:  Sinus rhythm Normal ECG When compared with ECG of 07/09/2015, No significant change was found Confirmed by [facility]  MD, Danaria Larsen ([id number]) on 04/27/2016 10:57:00 PM       Radiology Dg Chest 2 View  Result Date: 04/28/2016 CLINICAL DATA:  Confusion and weakness. EXAM: CHEST  2 VIEW COMPARISON:  07/09/2015 FINDINGS: The cardiomediastinal contours are unchanged with tortuosity of the thoracic aorta. Calcified granuloma in the right mid upper lung. Pulmonary vasculature is normal. No consolidation, pleural effusion, or pneumothorax. No acute osseous abnormalities are seen. IMPRESSION: No active cardiopulmonary disease. Electronically Signed   By: Jeb Levering M.D.   On: 04/28/2016 00:08   Ct Head Wo Contrast  Result Date: 04/28/2016 CLINICAL DATA:  Weakness and confusion. EXAM: CT HEAD WITHOUT CONTRAST TECHNIQUE: Contiguous axial images were obtained from the base of the skull through the vertex without intravenous contrast. COMPARISON:  Brain MRI 08/26/2015 FINDINGS: Brain: No evidence of acute infarction, hemorrhage, hydrocephalus, extra-axial collection or mass lesion/mass effect. No focal abnormality in the right insula at sites of acute ischemia on prior MRI. Stable atrophy and normal for age chronic small vessel ischemia. No subdural collection. Vascular: No hyperdense vessel or unexpected calcification. Atherosclerosis of skullbase vasculature. Skull: Post left craniotomy.  No acute fracture or focal lesion. Sinuses/Orbits: No acute finding. IMPRESSION: No acute intracranial abnormality. Stable atrophy and chronic small vessel ischemia. Electronically Signed   By: Jeb Levering M.D.   On: 04/28/2016 01:18    Procedures Procedures (including critical care time)  Medications Ordered in ED Medications  0.9 %  sodium chloride infusion (1,000 mLs  Intravenous New Bag/Given 04/28/16 0142)    Followed by  0.9 %  sodium chloride infusion (1,000 mLs Intravenous New Bag/Given 04/28/16 0142)     Initial Impression / Assessment and Plan / ED Course  I have reviewed the triage vital signs and the nursing notes.  Pertinent labs & imaging results that were available during my care of the patient were reviewed by me and considered in my medical decision making (see chart for details).  Clinical Course  Generalized weakness of uncertain cause. Exam does not show any focal findings to suggest stroke. She is afebrile and with normal heart rate and blood pressure. I'm concerned about occult infection. Old records are reviewed and she had been admitted last year with a subdural hematoma but has had no sequelae from that. Chest x-ray will be obtained to look for occult pneumonia, and urinalysis obtained. She does have elevated WBC with left shift.  Chest x-ray shows no evidence of pneumonia. Urinalysis is unremarkable. Head CT shows no evidence of bleed. Orthostatic vital signs show fairly dramatic drop in blood pressure. She will be given IV fluids.  Following IV fluids, she no longer had orthostatic pulse and blood pressure changes. She was ambulated in the ED and tolerated this well. She is discharged home with instructions to follow-up with PCP in the next several days.  Final Clinical Impressions(s) / ED Diagnoses   Final diagnoses:  Weakness  Orthostatic hypotension  Dehydration    New Prescriptions New Prescriptions   No medications on file   I personally performed the services described in this documentation, which was scribed in my presence. The recorded information has been reviewed and is accurate.      Delora Fuel, MD [id number]

## 2016-04-27 NOTE — ED Triage Notes (Signed)
Pt comes from home via [facility], pt family states for about a hour she had some confusion and weakness, was unable to get up out of chair. Orthostatic hypotension with EMS from 0000000 systolic. Pt AxO, hx of TIA

## 2016-04-28 ENCOUNTER — Emergency Department (HOSPITAL_COMMUNITY): Payer: Medicare Other

## 2016-04-28 LAB — URINALYSIS, ROUTINE W REFLEX MICROSCOPIC
Bilirubin Urine: NEGATIVE
GLUCOSE, UA: NEGATIVE mg/dL
KETONES UR: NEGATIVE mg/dL
Nitrite: NEGATIVE
PH: 8.5 — AB (ref 5.0–8.0)
Protein, ur: 30 mg/dL — AB
Specific Gravity, Urine: 1.006 (ref 1.005–1.030)

## 2016-04-28 LAB — URINE MICROSCOPIC-ADD ON

## 2016-04-28 LAB — CBG MONITORING, ED: Glucose-Capillary: 174 mg/dL — ABNORMAL HIGH (ref 65–99)

## 2016-04-28 MED ORDER — SODIUM CHLORIDE 0.9 % IV SOLN
1000.0000 mL | Freq: Once | INTRAVENOUS | Status: AC
  Administered 2016-04-28: 1000 mL via INTRAVENOUS

## 2016-04-28 MED ORDER — SODIUM CHLORIDE 0.9 % IV SOLN
1000.0000 mL | INTRAVENOUS | Status: DC
  Administered 2016-04-28: 1000 mL via INTRAVENOUS

## 2016-05-07 ENCOUNTER — Encounter: Payer: Self-pay | Admitting: Neurology

## 2016-05-21 ENCOUNTER — Ambulatory Visit: Payer: Medicare Other | Admitting: Neurology

## 2016-06-04 ENCOUNTER — Telehealth: Payer: Self-pay | Admitting: *Deleted

## 2016-06-04 NOTE — Telephone Encounter (Signed)
Spoke to daughter. She will call back to r/s 11/20 appt to early december when she is back in town. Dr Jaynee Eagles in [place] 11/20.

## 2016-07-01 ENCOUNTER — Ambulatory Visit: Payer: Medicare Other | Admitting: Neurology

## 2016-07-30 ENCOUNTER — Ambulatory Visit (INDEPENDENT_AMBULATORY_CARE_PROVIDER_SITE_OTHER): Payer: Medicare Other | Admitting: Neurology

## 2016-07-30 ENCOUNTER — Encounter: Payer: Self-pay | Admitting: Neurology

## 2016-07-30 VITALS — BP 116/70 | HR 80 | Ht 65.0 in | Wt 145.2 lb

## 2016-07-30 DIAGNOSIS — F0390 Unspecified dementia without behavioral disturbance: Secondary | ICD-10-CM

## 2016-07-30 MED ORDER — RIVASTIGMINE 9.5 MG/24HR TD PT24: 9.5000 mg | MEDICATED_PATCH | Freq: Every day | TRANSDERMAL | 12 refills | Status: DC

## 2016-07-30 NOTE — Patient Instructions (Addendum)
Remember to drink plenty of fluid, eat healthy meals and do not skip any meals. Try to eat protein with a every meal and eat a healthy snack such as fruit or nuts in between meals. Try to keep a regular sleep-wake schedule and try to exercise daily, particularly in the form of walking, 20-30 minutes a day, if you can.   As far as your medications are concerned, I would like to suggest: Increase the Rivastigmine to 9.5mg /24 hours  I would like to see you back in 6 months, sooner if we need to. Please call us with any interim questions, concerns, problems, updates or refill requests.   Our phone number is [phone]. We also have an after hours call service for urgent matters and there is a physician on-call for urgent questions. For any emergencies you know to call 911 or go to the nearest emergency room

## 2016-07-30 NOTE — Progress Notes (Signed)
[facility]    Provider:  Dr Jaynee Eagles Referring Provider: Lajean Manes, MD Primary Care Physician:  Mathews Argyle, MD   Interval history 07/30/2016: She is tired today, she was on a trip last night to [place]. Here with daughter. She lives in independent living and went with a group to [place]. Short-term memory worsening. She may be moving to assisted living. She needs assistance with medications. Time management is worsening. She may have to move.  She is struggling with her gait and transferring and getting in an out of care. She is getting weaker, she is not exercigin. She is at the [place] but will be going to [employer].When she gets to [place] patient's daughter will call for PT/OT.   Interval history 11/28/2015: Rebecca Travis is a 82 y.o. female here as a referral from Dr. Felipa Eth for memory problems. In August she underwent evacuation of a chronic subdural hematoma due to a fall. Since the surgery in August, she has been having memory problems. Mostly since the fall and hematoma. She is not having any side effects from the Exelon patch. Memory is stable. Discussed findings from carotid Dopplers and a 30 day cardiac monitor. Carotid Dopplers did not show any hemodynamically significant stenosis. The cardiac monitor did not show atrial fibrillation or atrial flutter. Echocardiogram showed mild left ventricular hypertrophy with grade 1 diastolic dysfunction Discussed the next steps of a workup including transesophageal echocardiogram and a loop recorder but patient and daughter declined. Did discuss that the patient has been treated were discovered atrial fibrillation she is at increased risks for stroke and they understand this. Discussed staying on ASA 325 and this is the only intervention/treatment that they feel comfortable with. She is feeling weaker, more difficulty ambulating, fatigue, not exercising, Sitting in a chair all day, near  falls, stumbling, decreased balance. No swallowing difficulty. No depression or mood issues. Sleeping well and eating well. Feel as though patient could benefit from physical therapy, will ask [facility] for in home therapy.   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 Reviewed the images with patient and her daughter and pointed out the abnormalities and bilateral distribution. Recommended starting patient on asa 325 and further workup for etiology which would include TTE/TEE, holter monitor, bascular imaging for etiolgy such as paroxysmal afib which can place patient at high risk for further strokes. Discussed the newer anticoagulants such as Eliquis. They would like to think about whether they will proceed or not.  MRi of the brain: Small focus of restricted diffusion anterior-most insula on the RIGHT consistent with a acute or subacute infarct. Low level subcentimeter sized areas of apparent restriction throughout the BILATERAL RIGHT greater than LEFT subcortical white matter, suspected to represent areas of subacute infarction. Given the different locations, a shower of emboli is possible.  HPI: Rebecca Travis is a 82 y.o. female here as a referral from Dr. Felipa Eth for memory problems. In August she underwent evacuation of a chronic subdural hematoma due to a fall. Since the surgery in August, she has been having memory problems. Mostly since the fall and hematoma. Daughter is with patient and provides most information, daughter says she fell and was having headaches and 8 weeks afterwards she was diagnosed with a hematoma. She had surgery. She was put on memory medication, galantamine, but she had increased fatigue and the medication was stopped.  She has also started having tremors. Started in November. Stopping the galantamine did not help the  tremors. Memory changes are progressive and worsening rapidly. She had baseline memory problems before the fall. She would get confused but she was living independently before the fall, she was active and independent, she was [employer]. Memory problems first started at least a few months before the fall, she would forget appointment and times. She would have to write everything down. Tremor in the hands and tremor in the mouth. She never had tremors before the bleed oruntil after the surgery. She had the surgery and did better but has been declining ever since then. She had nightmares on the Aricept. She has depression after all of this. There have been a lot of changes. She is in independent living now. Patient says she feels very sad. She is not happy. The lack of independence is worrisome. She is in a new environment in independent living. She has fatigue, not exercising like she used, she has PT twice a week. She has confusion sometimes, the phone was ringing and she picked up the remote control. Forgetting to turn the alarm system off or on. More short-term meory issues, she gets distracted, difficulty focusing.   Reviewed notes, labs and imaging from outside physicians, which showed:  [facility]: Patient was seen by Dr Kathyrn Sheriff Who performed the evacuation of the chronic subdural hematoma. He states that after that she had improvement in headaches but continues to experience generalized fatigue as well as bilateral tremor which is worse in the morning and continued difficulty with memory. She was taken to the emergency department for these complaints at the end of November when repeat CT scan was done which was essentially normal without evidence of any further left-sided subdural hematoma. There were subsequently seen by their geriatrician Actigraph galantamine. Fatigue has improved but the rest of the symptoms are unchanged and she was sent here for  evaluation.  CT of the head 06/2015: personally reviewed images and agree with findings:: Findings consistent with status post left craniotomy. Calvarium intact otherwise. Diffuse atrophy with white matter low attenuation. No evidence of acute vascular territory infarct or mass. No hemorrhage or extra-axial fluid.  IMPRESSION: Severe chronic involutional change with no acute findings in this patient with prior subdural hematoma on the left. No evidence of extra-axial fluid collection today.  Review of Systems: Patient complains of symptoms per HPI as well as the following symptoms: fatigue, cough, runny nose. Pertinent negatives per HPI. All others negative.   Social History   Social History  . Marital status: Divorced    Spouse name: N/A  . Number of children: N/A  . Years of education: N/A   Occupational History  . Retired    Social History Main Topics  . Smoking status: Former Smoker    Packs/day: 0.15    Types: Cigarettes    Quit date: 08/13/1951  . Smokeless tobacco: Never Used  . Alcohol use 0.0 oz/week     Comment: 1 glass wine per week  . Drug use: No  . Sexual activity: Not Currently   Other Topics Concern  . Not on file   Social History Narrative   Lives alone   Caffeine use: none     Family History  Problem Relation Age of Onset  . Stroke Father   . Dementia Neg Hx     Past Medical History:  Diagnosis Date  . Arthritis    shoulder,  knee  - otc med prn  . Cancer ([place])   . GERD (gastroesophageal reflux disease)   . Hyperlipidemia    diet controlled - no meds  . Hypertension   . Hypothyroidism   . Subdural hematoma ([place])   . SVD (spontaneous vaginal delivery)    x 3    Past Surgical History:  Procedure Laterality Date  . BREAST SURGERY     right breast duct   . COLONOSCOPY    . CRANIOTOMY Left 03/25/2015   Procedure: CRANIOTOMY HEMATOMA EVACUATION SUBDURAL;  Surgeon: Consuella Lose, MD;  Location: [facility];  Service: Neurosurgery;   Laterality: Left;  . HYSTEROSCOPY W/D&C N/A 09/15/2013   Procedure: DILATATION AND CURETTAGE /HYSTEROSCOPY WITH UTERINE POLYP RESECTION;  Surgeon: Eldred Manges, MD;  Location: [facility];  Service: Gynecology;  Laterality: N/A;  . TOOTH EXTRACTION    . TOTAL THYROIDECTOMY      Current Outpatient Prescriptions  Medication Sig Dispense Refill  . acetaminophen (TYLENOL) 500 MG tablet Take 1,000 mg by mouth every 6 (six) hours as needed for moderate pain.    Marland Kitchen amLODipine (NORVASC) 2.5 MG tablet Take 2.5 mg by mouth daily.  12  . aspirin 325 MG tablet Take 325 mg by mouth daily.    . cycloSPORINE (RESTASIS) 0.05 % ophthalmic emulsion Place 1 drop into both eyes 2 (two) times daily.    Marland Kitchen lisinopril (PRINIVIL,ZESTRIL) 20 MG tablet Take 20 mg by mouth daily.     . memantine (NAMENDA) 10 MG tablet Take 1 tablet (10 mg total) by mouth 2 (two) times daily. 60 tablet 12  . Multiple Vitamin (MULTIVITAMIN) tablet Take 1 tablet by mouth daily.    Marland Kitchen SYNTHROID 75 MCG tablet Take 75 mcg by mouth daily.    . ranitidine (ZANTAC) 150 MG tablet Take 150 mg by mouth 2 (two) times daily.    . rivastigmine (EXELON) 9.5 mg/24hr Place 1 patch (9.5 mg total) onto the skin daily. 30 patch 12   No current facility-administered medications for this visit.     Allergies as of 07/30/2016 - Review Complete 07/30/2016  Allergen Reaction Noted  . Aricept [donepezil hcl] Other (See Comments) 03/25/2015  . Benzonatate Diarrhea 03/25/2015  . Cefuroxime Diarrhea 03/25/2015  . Donepezil Other (See Comments)   . Fish oil Diarrhea and Other (See Comments) 03/25/2015  . Gabapentin Other (See Comments) 03/25/2015  . Statins Other (See Comments) 03/25/2015  . Ezetimibe Other (See Comments) 07/09/2015  . Loratadine Other (See Comments) 03/25/2015  . Tramadol Other (See Comments) 07/09/2015    Vitals: BP 116/70 (BP Location: Right Arm, Patient Position: Sitting, Cuff Size: Normal)   Pulse 80   Ht 5\' 5"  (1.651 m)   Wt 145 lb  3.2 oz (65.9 kg)   BMI 24.16 kg/m  Last Weight:  Wt Readings from Last 1 Encounters:  07/30/16 145 lb 3.2 oz (65.9 kg)   Last Height:   Ht Readings from Last 1 Encounters:  07/30/16 5\' 5"  (1.651 m)   Montreal Cognitive Assessment  08/17/2015  Visuospatial/ Executive (0/5) 0  Naming (0/3) 3  Attention: Read list of digits (0/2) 1  Attention: Read list of letters (0/1) 1  Attention: Serial 7 subtraction starting at 100 (0/3) 0  Language: Repeat phrase (0/2) 2  Language : Fluency (0/1) 0  Abstraction (0/2) 2  Delayed Recall (0/5) 0  Orientation (0/6) 1  Total 10  Adjusted Score (based on education) 11   .moca  Cranial Nerves:  The  pupils are equal, round, and reactive to light. Visual fields are full to finger confrontation. Impaired upgaze otherwise extraocular movements are intact. Trigeminal sensation is intact and the muscles of mastication are normal. The face is symmetric. The palate elevates in the midline. Hearing intact. Voice is normal. Shoulder shrug is normal. The tongue has normal motion without fasciculations.   Coordination:  Normal finger to nose and heel to shin.   Gait:  difficulty getting up from seat. Imabalance. Difficulty with tandem gait.   Motor Observation:  "No no" fine head tremor  Postural tremor high frequency, low amplitude  Minimal dec. Amplitude but ok finger taps Tone:  Normal muscle tone.   Posture:  Posture is normal. normal erect   Strength: Proximal weakness legs > arms.     Sensation: intact to LT   Reflex Exam:  DTR's:  Deep tendon reflexes in the upper and lower extremities are brisk bilaterally.  Toes:  The toes are upgoing bilaterally.  Clonus:  Clonus is absent.     Assessment/Plan: This is a lovely 82 year old female who had some baseline memory deficits and then fell causing a chronic subdural hematoma with subsequent evacuation. Since then patient's cognition has been  further declining, likely dementia. Feel that this is multifactorial, it appears as though patient did have some baseline memory deficits which were worsened by the chronic subdural hematoma however patient is also depressed with fatigue, there have been a lot of changes in her life including a new living situation and what patient describes as loss of independence in her new living situation. I did have a long discussion with patient and her daughter. Patient may not regain her previous baseline before the chronic subdural hematoma. I do recommend following up with primary care for depression and fatigue. Encouraged patient to be social with in her new living situation and be involved with some of the activities. The Montral cognitive assessment score is in the dementia range of 11 out of 30. Checked thyroid for tremor, B12, rpr, cmp and cbc for fatigue and memory problems, all of which were unremarkable .   - needs PT/OT patient will call when she moves to assisted living.   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   - Recommended continuing patient on asa 325 and further workup for etiology which would include TTE/TEE, holter monitor, vascular imaging for etiolgy such as paroxysmal afib which can place patient at high risk for further strokes. Discussed the newer anticoagulants such as Eliquis. Patient is on aspirin 325. Echocardiogram showed mild left ventricular hypertrophy with grade 1 diastolic dysfunction ( asked them to follow-up with primary care regarding this).  The cardiac monitor did not show atrial fibrillation or atrial flutter.  Discussed the next steps of a workup including transesophageal echocardiogram and a loop recorder but patient and daughter declined.  Discussed staying on ASA 325 and this is the only intervention/treatment that they feel comfortable with and do not wish to further pursue  evaluation for atrial fibrillation despite increased risk of stroke and are not interested in medication such as request.  -Patient needs assisted living. Today's history and physical demonstrated measurable cognitive losses consistent with dementia. Based on the prior experiences in the community and the degree of impairment is clear that she does not have the capacity to make an informed and appropriate decisions on her healthcare and finances. I do recommend that she lives in a  assisted living setting as soon as possible for her safety. She can perform all her own ADLs and she is lovely with no behavioral issues, she will do excellent in assisted living.  She is at risk for falls and self-neglect in independent living.    Sarina Ill, MD  [facility] [address] [place], [place] [address]  Phone [phone] Fax [phone]  A total of 30 minutes was spent face-to-face with this patient. Over half this time was spent on counseling patient on the dementia and embolic ischemic infarts diagnosis and gait abnormality and weakness and different diagnostic and therapeutic options available.

## 2016-08-03 ENCOUNTER — Encounter: Payer: Self-pay | Admitting: Neurology

## 2016-08-03 ENCOUNTER — Telehealth: Payer: Self-pay | Admitting: Neurology

## 2016-08-03 NOTE — Telephone Encounter (Signed)
Needs a letter to move mother to assisted living please mail to:  Mail to [name] [phone] Rebecca Travis daughter.

## 2016-08-06 NOTE — Telephone Encounter (Signed)
Rebecca Travis, I wrote a letter for patient. Will you call daughter and see if she still wants is to mail it? If she wants it sooner we can fax it or I can scan it into email and email to her so she can have it asap. Also please print and let me sign thanks

## 2016-08-07 NOTE — Telephone Encounter (Signed)
Called and spoke with daughter, Rebecca Travis. She would like AA,MD to scan in letter and email to [email or web address]. Advised AA,MD not in yet this am but I will give her the message.  Dr Jaynee Eagles- I have letter printed out for you to sign.   Also, daughter said her geritatic doctor had already increased rivastigmine to highest dose. She said Dr Jaynee Eagles called in 9.5mg  but she is already on the highest dose possible. She thinks the 13mg .

## 2016-08-21 ENCOUNTER — Telehealth: Payer: Self-pay | Admitting: Neurology

## 2016-08-21 DIAGNOSIS — R531 Weakness: Secondary | ICD-10-CM

## 2016-08-21 DIAGNOSIS — R269 Unspecified abnormalities of gait and mobility: Secondary | ICD-10-CM

## 2016-08-21 NOTE — Telephone Encounter (Signed)
Hinton Dyer- I placed referral, thank you!  I called and advised daughter that referral placed and they should hear about scheduling within a week or two. She verbalized understanding.

## 2016-08-21 NOTE — Telephone Encounter (Signed)
Called and spoke to Patient's Daughter Steele Berg order has been faxed.  [facility] .  Att [id number] - fax [phone] Att. Canaan Baker.

## 2016-08-21 NOTE — Telephone Encounter (Signed)
Yes, patient was supposed to call back with this information for me. Please place a referral for PT with this information, let dana know as well thanks

## 2016-08-21 NOTE — Telephone Encounter (Signed)
Dr Jaynee Eagles- did you speak to the daughter about this? I do not see any orders in EPIC for PT.

## 2016-08-21 NOTE — Telephone Encounter (Signed)
Pt's daughter called to advise order for PT should be sent to [facility] [phone]  [phone]  Attn: Armanda Heritage

## 2016-08-21 NOTE — Telephone Encounter (Signed)
Noted Referral Faxed. Thanks Terrence Dupont.

## 2017-02-18 ENCOUNTER — Ambulatory Visit: Payer: Medicare Other | Admitting: Neurology

## 2017-03-10 ENCOUNTER — Encounter: Payer: Self-pay | Admitting: Neurology

## 2017-03-10 ENCOUNTER — Ambulatory Visit (INDEPENDENT_AMBULATORY_CARE_PROVIDER_SITE_OTHER): Payer: Medicare Other | Admitting: Neurology

## 2017-03-10 VITALS — BP 128/77 | HR 67 | Ht 64.0 in | Wt 155.4 lb

## 2017-03-10 DIAGNOSIS — F0390 Unspecified dementia without behavioral disturbance: Secondary | ICD-10-CM | POA: Diagnosis not present

## 2017-03-10 NOTE — Progress Notes (Signed)
[facility]    Provider:  Dr Jaynee Eagles Referring Provider: Lajean Manes, MD Primary Care Physician:  Lajean Manes, MD   Interval history 03/10/2017: She is in assisted at [facility]. She is very happy, they take trips, they go to [employer] for dinner. She doesn't cook. They assist with medication, they have a med tech that comes around with medications. She is not getting exercise, decreased energy, she doesn't have the energy she used to have but still very social. No falls. No problems swallowing, no choking. Memory is stable, just slowly progressive short-term memory.No Side effects to Rivastigmine and Namenda on full dose.   Interval history 07/30/2016: She is tired today, she was on a trip last night to [place]. Here with daughter. She lives in independent living and went with a group to [place]. Short-term memory worsening. She may be moving to assisted living. She needs assistance with medications. Time management is worsening. She may have to move.  She is struggling with her gait and transferring and getting in an out of care. She is getting weaker, she is not exercigin. She is at the [place] but will be going to [place] [employer].When she gets to [place] patient's daughter will call for PT/OT.   Interval history 11/28/2015: Rebecca Travis is a 83 y.o. female here as a referral from Dr. Felipa Eth for memory problems. In August she underwent evacuation of a chronic subdural hematoma due to a fall. Since the surgery in August, she has been having memory problems. Mostly since the fall and hematoma. She is not having any side effects from the Exelon patch. Memory is stable. Discussed findings from carotid Dopplers and a 30 day cardiac monitor. Carotid Dopplers did not show any hemodynamically significant stenosis. The cardiac monitor did not show atrial fibrillation or atrial flutter. Echocardiogram showed mild left ventricular hypertrophy with grade 1  diastolic dysfunction Discussed the next steps of a workup including transesophageal echocardiogram and a loop recorder but patient and daughter declined. Did discuss that the patient has been treated were discovered atrial fibrillation she is at increased risks for stroke and they understand this. Discussed staying on ASA 325 and this is the only intervention/treatment that they feel comfortable with. She is feeling weaker, more difficulty ambulating, fatigue, not exercising, Sitting in a chair all day, near falls, stumbling, decreased balance. No swallowing difficulty. No depression or mood issues. Sleeping well and eating well. Feel as though patient could benefit from physical therapy, will ask [facility] for in home therapy.   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 Reviewed the images with patient and her daughter and pointed out the abnormalities and bilateral distribution. Recommended starting patient on asa 325 and further workup for etiology which would include TTE/TEE, holter monitor, bascular imaging for etiolgy such as paroxysmal afib which can place patient at high risk for further strokes. Discussed the newer anticoagulants such as Eliquis. They would like to think about whether they will proceed or not.  MRi of the brain: Small focus of restricted diffusion anterior-most insula on the RIGHT consistent with a acute or subacute infarct. Low level subcentimeter sized areas of apparent restriction throughout the BILATERAL RIGHT greater than LEFT subcortical white matter, suspected to represent areas of subacute infarction. Given the different locations, a shower of emboli is possible.  HPI: Rebecca Travis is a 83 y.o. female here  as a referral from Dr. Felipa Eth for memory problems. In August she underwent evacuation of a chronic  subdural hematoma due to a fall. Since the surgery in August, she has been having memory problems. Mostly since the fall and hematoma. Daughter is with patient and provides most information, daughter says she fell and was having headaches and 8 weeks afterwards she was diagnosed with a hematoma. She had surgery. She was put on memory medication, galantamine, but she had increased fatigue and the medication was stopped. She has also started having tremors. Started in November. Stopping the galantamine did not help the tremors. Memory changes are progressive and worsening rapidly. She had baseline memory problems before the fall. She would get confused but she was living independently before the fall, she was active and independent, she was [employer]. Memory problems first started at least a few months before the fall, she would forget appointment and times. She would have to write everything down. Tremor in the hands and tremor in the mouth. She never had tremors before the bleed oruntil after the surgery. She had the surgery and did better but has been declining ever since then. She had nightmares on the Aricept. She has depression after all of this. There have been a lot of changes. She is in independent living now. Patient says she feels very sad. She is not happy. The lack of independence is worrisome. She is in a new environment in independent living. She has fatigue, not exercising like she used, she has PT twice a week. She has confusion sometimes, the phone was ringing and she picked up the remote control. Forgetting to turn the alarm system off or on. More short-term meory issues, she gets distracted, difficulty focusing.   Reviewed notes, labs and imaging from outside physicians, which showed:  [facility]: Patient was seen by Dr Kathyrn Sheriff Who performed the evacuation of the chronic subdural hematoma. He states that after that she had improvement in  headaches but continues to experience generalized fatigue as well as bilateral tremor which is worse in the morning and continued difficulty with memory. She was taken to the emergency department for these complaints at the end of November when repeat CT scan was done which was essentially normal without evidence of any further left-sided subdural hematoma. There were subsequently seen by their geriatrician Actigraph galantamine. Fatigue has improved but the rest of the symptoms are unchanged and she was sent here for evaluation.  CT of the head 06/2015: personally reviewed images and agree with findings:: Findings consistent with status post left craniotomy. Calvarium intact otherwise. Diffuse atrophy with white matter low attenuation. No evidence of acute vascular territory infarct or mass. No hemorrhage or extra-axial fluid.  IMPRESSION: Severe chronic involutional change with no acute findings in this patient with prior subdural hematoma on the left. No evidence of extra-axial fluid collection today.  Review of Systems: Patient complains of symptoms per HPI as well as the following symptoms: fatigue, cough, runny nose. Pertinent negatives per HPI. All others negative.  Social History   Social History  . Marital status: Divorced    Spouse name: N/A  . Number of children: N/A  . Years of education: N/A   Occupational History  . Retired    Social History Main Topics  . Smoking status: Former Smoker    Packs/day: 0.15    Types: Cigarettes    Quit date: 08/13/1951  . Smokeless tobacco: Never Used  . Alcohol use 0.0  oz/week     Comment: 1 glass wine per week  . Drug use: No  . Sexual activity: Not Currently   Other Topics Concern  . Not on file   Social History Narrative   Lives alone   Caffeine use: none     Family History  Problem Relation Age of Onset  . Stroke Father   . Dementia Neg Hx     Past Medical History:  Diagnosis Date  . Arthritis    shoulder, knee  -  otc med prn  . Cancer ([place])   . GERD (gastroesophageal reflux disease)   . Hyperlipidemia    diet controlled - no meds  . Hypertension   . Hypothyroidism   . Subdural hematoma ([place])   . SVD (spontaneous vaginal delivery)    x 3    Past Surgical History:  Procedure Laterality Date  . BREAST SURGERY     right breast duct   . COLONOSCOPY    . CRANIOTOMY Left 03/25/2015   Procedure: CRANIOTOMY HEMATOMA EVACUATION SUBDURAL;  Surgeon: Consuella Lose, MD;  Location: [facility];  Service: Neurosurgery;  Laterality: Left;  . HYSTEROSCOPY W/D&C N/A 09/15/2013   Procedure: DILATATION AND CURETTAGE /HYSTEROSCOPY WITH UTERINE POLYP RESECTION;  Surgeon: Eldred Manges, MD;  Location: [facility];  Service: Gynecology;  Laterality: N/A;  . TOOTH EXTRACTION    . TOTAL THYROIDECTOMY      Current Outpatient Prescriptions  Medication Sig Dispense Refill  . acetaminophen (TYLENOL) 500 MG tablet Take 1,000 mg by mouth every 6 (six) hours as needed for moderate pain.    Marland Kitchen amLODipine (NORVASC) 5 MG tablet     . aspirin 325 MG tablet Take 325 mg by mouth daily.    . cycloSPORINE (RESTASIS) 0.05 % ophthalmic emulsion Place 1 drop into both eyes 2 (two) times daily.    Marland Kitchen lisinopril (PRINIVIL,ZESTRIL) 20 MG tablet Take 20 mg by mouth daily.     . memantine (NAMENDA) 10 MG tablet Take 1 tablet (10 mg total) by mouth 2 (two) times daily. 60 tablet 12  . Multiple Vitamin (MULTIVITAMIN) tablet Take 1 tablet by mouth daily.    . rivastigmine (EXELON) 13.3 MG/24HR     . SYNTHROID 75 MCG tablet Take 75 mcg by mouth daily.    . ranitidine (ZANTAC) 150 MG tablet Take 150 mg by mouth 2 (two) times daily.     No current facility-administered medications for this visit.     Allergies as of 03/10/2017 - Review Complete 03/10/2017  Allergen Reaction Noted  . Aricept [donepezil hcl] Other (See Comments) 03/25/2015  . Benzonatate Diarrhea 03/25/2015  . Cefuroxime Diarrhea 03/25/2015  . Donepezil Other (See  Comments)   . Fish oil Diarrhea and Other (See Comments) 03/25/2015  . Gabapentin Other (See Comments) 03/25/2015  . Statins Other (See Comments) 03/25/2015  . Ezetimibe Other (See Comments) 07/09/2015  . Loratadine Other (See Comments) 03/25/2015  . Tramadol Other (See Comments) 07/09/2015    Vitals: BP 128/77   Pulse 67   Ht 5\' 4"  (1.626 m)   Wt 155 lb 6.4 oz (70.5 kg)   BMI 26.67 kg/m  Last Weight:  Wt Readings from Last 1 Encounters:  03/10/17 155 lb 6.4 oz (70.5 kg)   Last Height:   Ht Readings from Last 1 Encounters:  03/10/17 5\' 4"  (1.626 m)   No flowsheet data found.  Neurologic exam: Pupils are equally round and reactive, visual fields are full, impaired upgaze otherwise normal extraocular movements,  face is symmetric, hearing intact to voice, no dysmetria, some difficulty getting up from seated position with imbalance on gait, patient has no tremor today, normal muscle tone, some minimal proximal weakness, sensation intact, reflexes are brisk.   Assessment/Plan: This is a lovely 83 year old female who had some baseline memory deficits and then fell causing a chronic subdural hematoma with subsequent evacuation. Since then patient's cognition has been further declining, likely dementia. Feel that this is multifactorial, it appears as though patient did have some baseline memory deficits which were worsened by the chronic subdural hematoma however patient is also depressed with fatigue, there have been a lot of changes in her life including a new living situation and what patient describes as loss of independence in her new living situation. I did have a long discussion with patient and her daughter. Patient may not regain her previous baseline before the chronic subdural hematoma. I do recommend following up with primary care for depression and fatigue. Encouraged patient to be social with in her new living situation and be involved with some of the activities. The [place]  cognitive assessment score is in the dementia range of 11 out of 30. Checked thyroid for tremor, B12, rpr, cmp and cbc for fatigue and memory problems, all of which were unremarkable .   Dementia: stable. Doing well. Is in assisted living now. Will continue Namenda and Exelon.   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   - Recommended continuing patient on asa 325 and further workup for etiology which would include TTE/TEE, holter monitor, vascular imaging for etiolgy such as paroxysmal afib which can place patient at high risk for further strokes. Discussed the newer anticoagulants such as Eliquis. Patient is on aspirin 325. Echocardiogram showed mild left ventricular hypertrophy with grade 1 diastolic dysfunction ( asked them to follow-up with primary care regarding this). The cardiac monitor did not show atrial fibrillation or atrial flutter. Discussed the next steps of a workup including transesophageal echocardiogram and a loop recorder but patient and daughter declined. Discussed staying on ASA 325 and this is the only intervention/treatment that they feel comfortable with and do not wish to further pursue evaluation for atrial fibrillation despite increased risk of stroke and are not interested in medication such as request.  -Patient needs assisted living. Today's history and physical demonstrated measurable cognitive losses consistent with dementia. Based on the prior experiences in the community and the degree of impairment is clear that she does not have the capacity to make an informed and appropriate decisions on her healthcare and finances. I do recommend that she lives in a assisted living setting as soon as possible for her safety. She can perform all her own ADLs and she is lovely with no behavioral issues, she will do excellent in assisted living.  She is at risk for falls and self-neglect in  independent living.  Sarina Ill, MD  [facility] [address] [place], [place] [address]  Phone [phone] Fax [phone]  A total of 15 minutes was spent face-to-face with this patient. Over half this time was spent on counseling patient on the dementia diagnosis and different diagnostic and therapeutic options available.

## 2017-03-10 NOTE — Patient Instructions (Signed)
Remember to drink plenty of fluid, eat healthy meals and do not skip any meals. Try to eat protein with a every meal and eat a healthy snack such as fruit or nuts in between meals. Try to keep a regular sleep-wake schedule and try to exercise daily, particularly in the form of walking, 20-30 minutes a day, if you can.   As far as your medications are concerned, I would like to suggest: continue current medications  I would like to see you back in 1 year, sooner if we need to. Please call us with any interim questions, concerns, problems, updates or refill requests.   Please also call us for any test results so we can go over those with you on the phone.  My clinical assistant and will answer any of your questions and relay your messages to me and also relay most of my messages to you.   Our phone number is [phone]. We also have an after hours call service for urgent matters and there is a physician on-call for urgent questions. For any emergencies you know to call 911 or go to the nearest emergency room

## 2017-09-11 ENCOUNTER — Other Ambulatory Visit: Payer: Self-pay | Admitting: Dermatology

## 2017-09-24 IMAGING — CT CT HEAD W/O CM
3 of 4 series · 17 of 47 positions shown, 20 images · non-contrast
Comparison: Brain MRI 08/26/2015

CLINICAL DATA: Weakness and confusion.

EXAM:
CT HEAD WITHOUT CONTRAST
TECHNIQUE: Contiguous axial images were obtained from the base of the skull
through the vertex without intravenous contrast.

[Series 301: head w/o, idose (1) · axial · non-contrast · 0.42mm/px · z∈[+69,+219]mm · 11 of 36 slices shown, 14 images]
[im 3/36  brain]
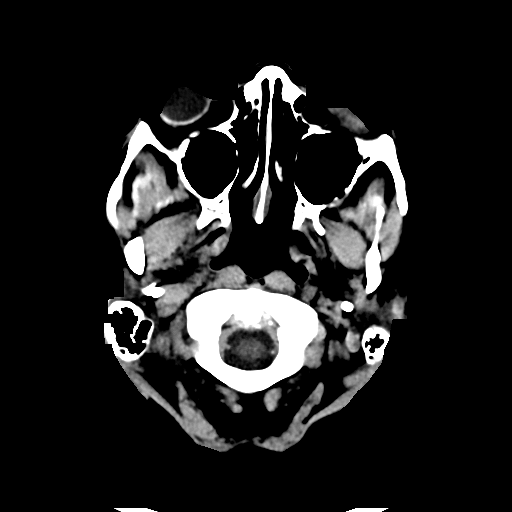
[im 3/36  bone]
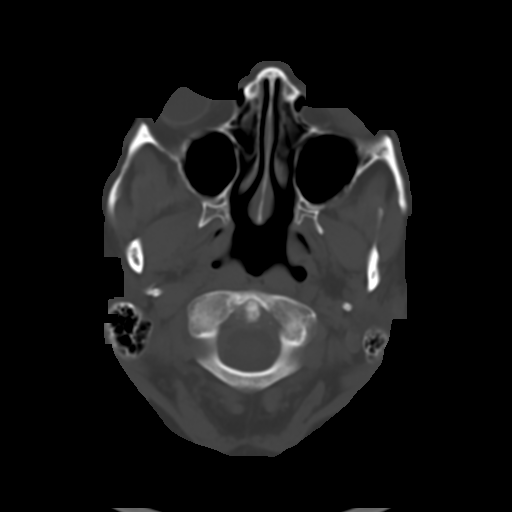
[im 6/36  brain]
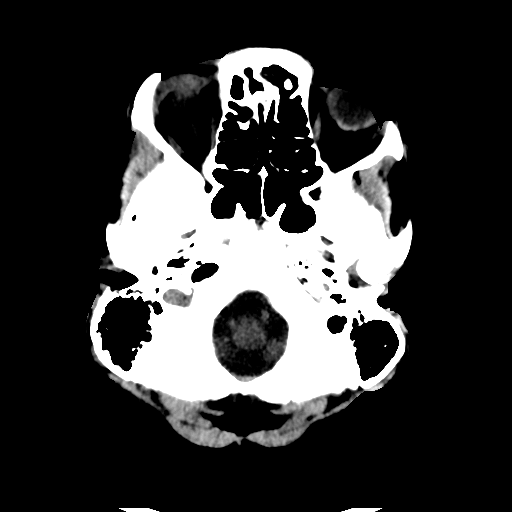
[im 8/36  brain]
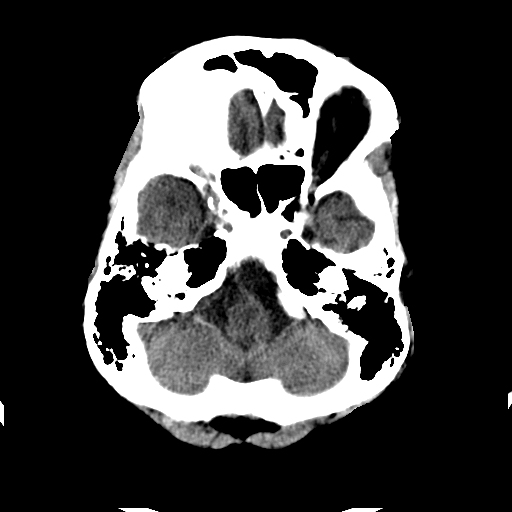
[im 13/36  brain]
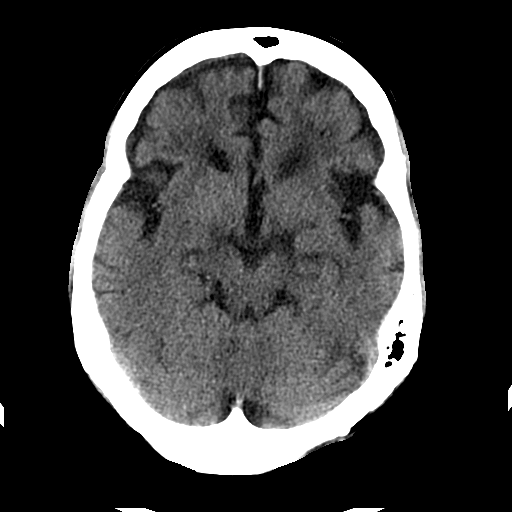
[im 16/36  brain]
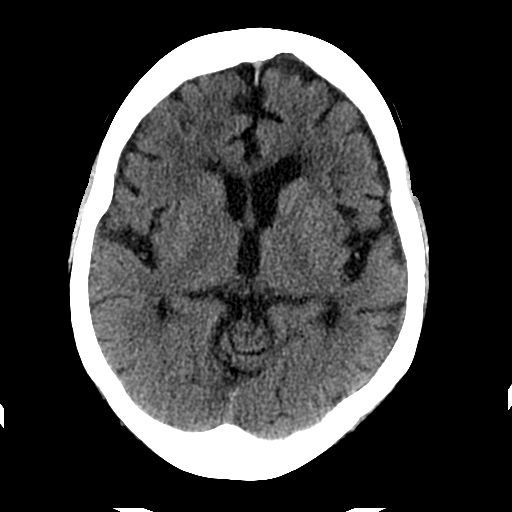
[im 16/36  bone]
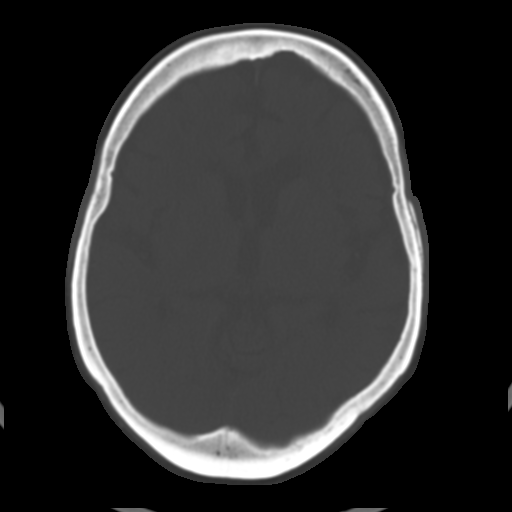
[im 18/36  brain]
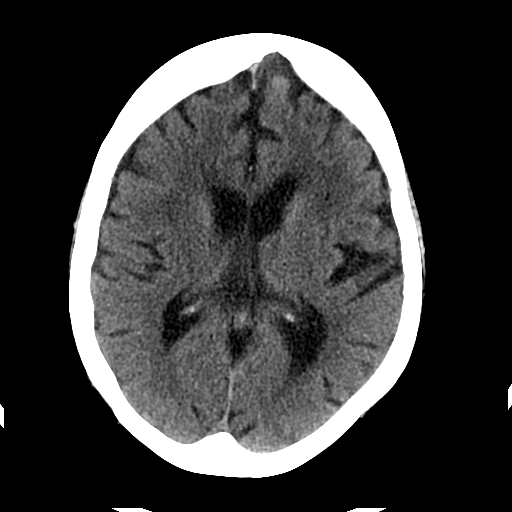
[im 21/36  brain]
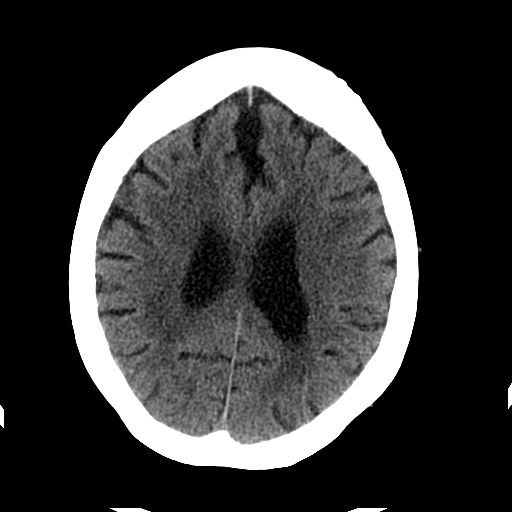
[im 23/36  brain]
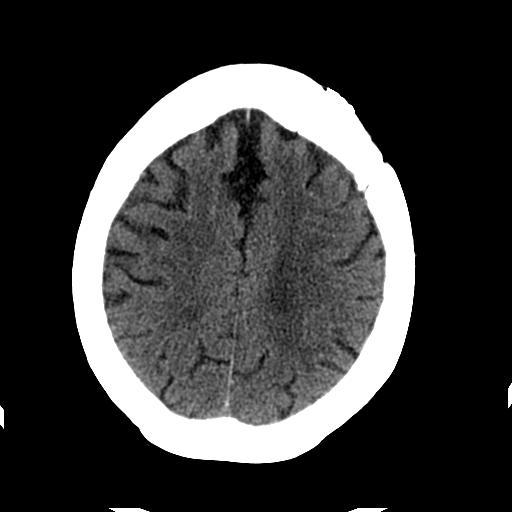
[im 28/36  brain]
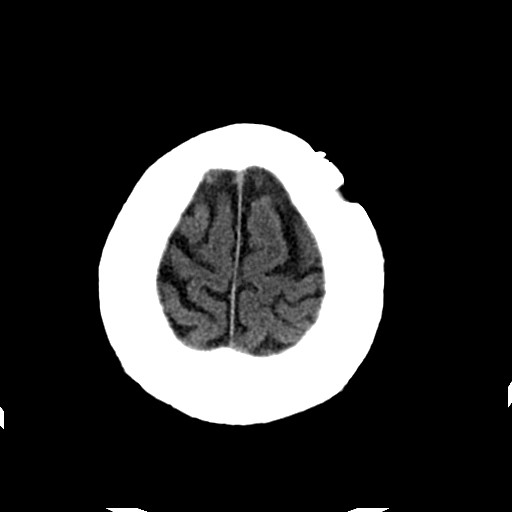
[im 28/36  bone]
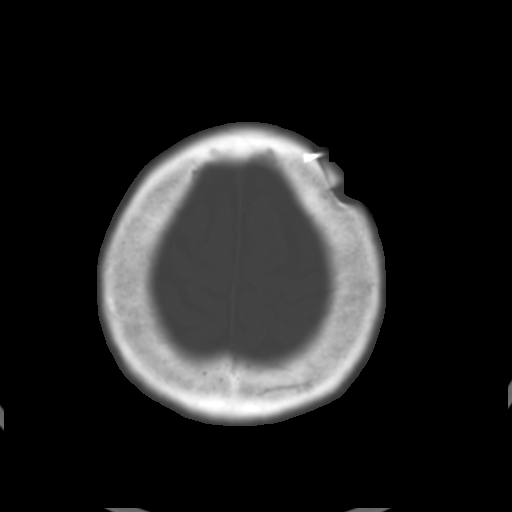
[im 31/36  brain]
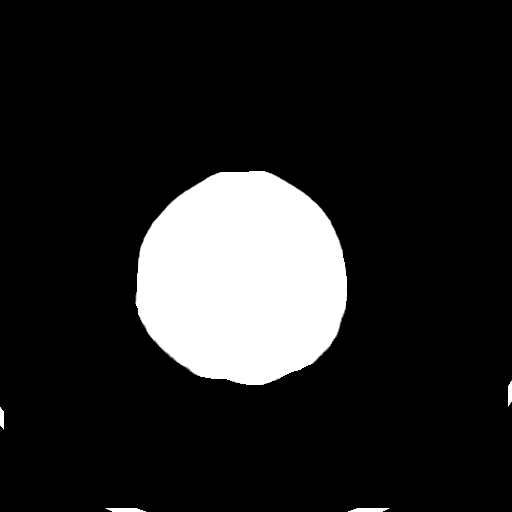
[im 33/36  brain]
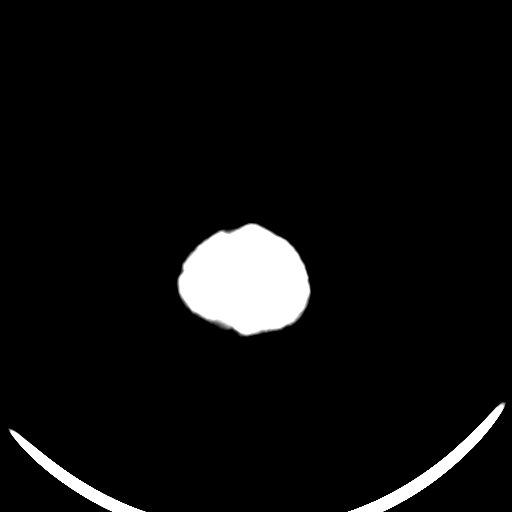

[Series 303: coronal st, idose (1) · coronal · 0.40mm/px · 3 of 69 slices shown]
[im 23/69  brain]
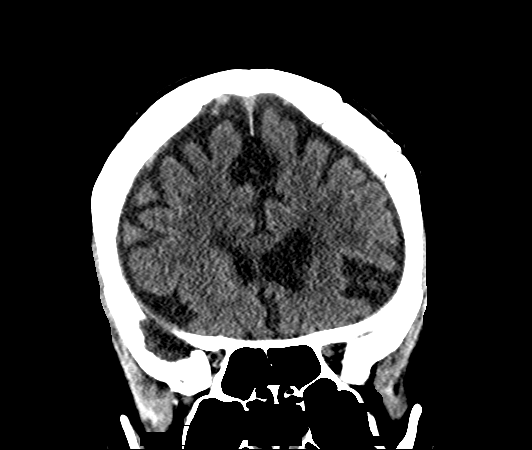
[im 31/69  brain]
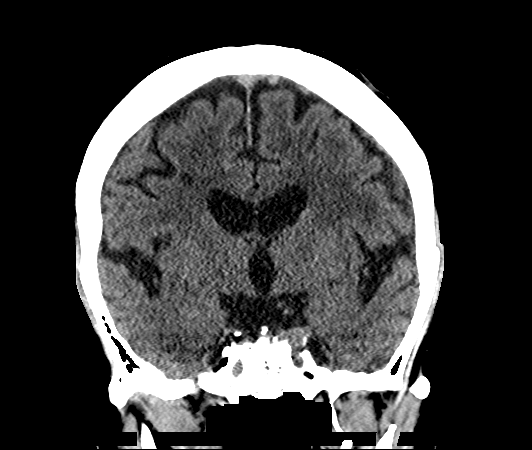
[im 38/69  brain]
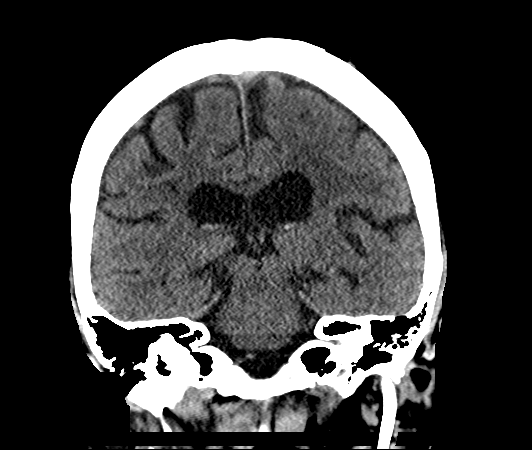

[Series 304: sagittal st, idose (1) · sagittal · 0.40mm/px · 3 of 71 slices shown]
[im 24/71  brain]
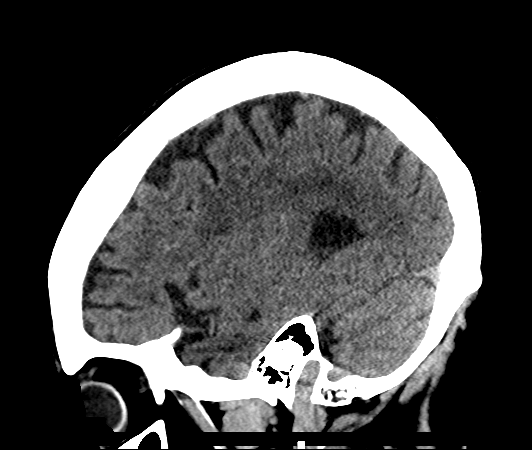
[im 36/71  brain]
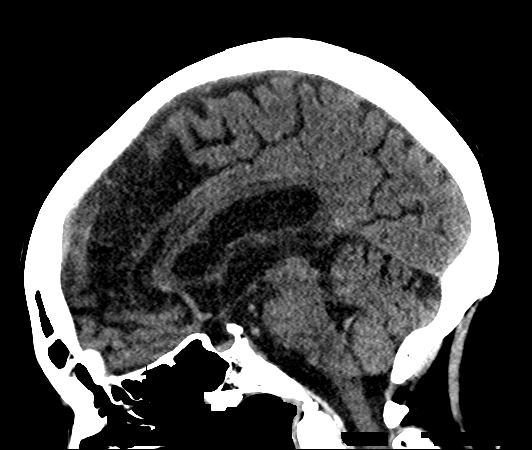
[im 47/71  brain]
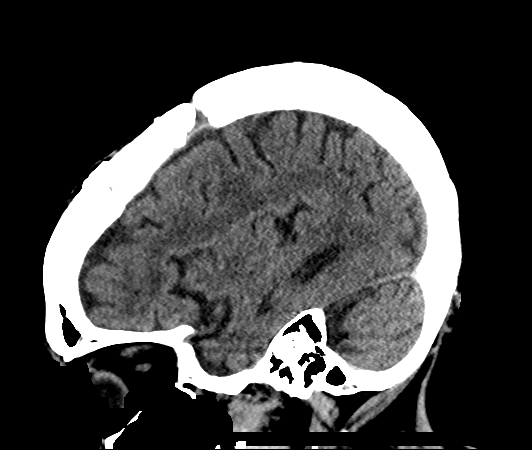

[17 of 47 positions shown; findings below may reference images not displayed]

FINDINGS: Brain: No evidence of acute infarction, hemorrhage, hydrocephalus,
extra-axial collection or mass lesion/mass effect. No focal
abnormality in the right insula at sites of acute ischemia on prior
MRI. Stable atrophy and normal for age chronic small vessel
ischemia. No subdural collection.

Vascular: No hyperdense vessel or unexpected calcification.
Atherosclerosis of skullbase vasculature.

Skull: Post left craniotomy.  No acute fracture or focal lesion.

Sinuses/Orbits: No acute finding.
IMPRESSION: No acute intracranial abnormality. Stable atrophy and chronic small
vessel ischemia.

## 2018-09-18 ENCOUNTER — Telehealth: Payer: Self-pay | Admitting: Neurology

## 2018-09-18 NOTE — Telephone Encounter (Signed)
Exelon and namenda are both used for dementia and often used together because they are 2 different drug classes but treat the same thing. I think they probably stopped the Namenda because they thought it was giving her side effects or not making a difference. She can sty of of the namenda. If she has been off of it I wouldn;t worry about withdrawal, bt if she is still on it she can stop one dose for a few days then stop the second dose. thanks

## 2018-09-18 NOTE — Telephone Encounter (Signed)
Called the daughter and left a detailed message for her in regards to the medication and informed her what Dr Jaynee Eagles mentioned. I left the message with everything mentioned. Advised that since we haven't seen her since 2018 hard to know exact reason behind stopping the namenda. Advised they are commonly used together but if she is unable to tolerate the namenda it is safe to do what Dr Felipa Eth recommends. Advised there shouldn't be any side effects. LVM if she wants to call back our office will reopen on Monday.

## 2018-09-18 NOTE — Telephone Encounter (Signed)
Pt's daughter/[name] DPR said Dr Stoneking/geriatric agreed that the namenda and exelon is in the same family and she does not need to be taking both. So should the patient stop taking the namenda and she also wants to know if she should she be concerned about withdrawals if she does not need to take it anymore.

## 2019-09-27 ENCOUNTER — Inpatient Hospital Stay (HOSPITAL_COMMUNITY)
Admission: EM | Admit: 2019-09-27 | Discharge: 2019-10-01 | DRG: 684 | Disposition: A | Discharge status: Home / Self Care | Payer: Medicare Other | Source: Skilled Nursing Facility | Attending: Internal Medicine | Admitting: Internal Medicine

## 2019-09-27 ENCOUNTER — Other Ambulatory Visit: Payer: Self-pay

## 2019-09-27 ENCOUNTER — Encounter (HOSPITAL_COMMUNITY): Payer: Self-pay | Admitting: Emergency Medicine

## 2019-09-27 DIAGNOSIS — Z66 Do not resuscitate: Secondary | ICD-10-CM | POA: Diagnosis present

## 2019-09-27 DIAGNOSIS — I10 Essential (primary) hypertension: Secondary | ICD-10-CM | POA: Diagnosis present

## 2019-09-27 DIAGNOSIS — Z79890 Hormone replacement therapy (postmenopausal): Secondary | ICD-10-CM

## 2019-09-27 DIAGNOSIS — N179 Acute kidney failure, unspecified: Secondary | ICD-10-CM | POA: Diagnosis not present

## 2019-09-27 DIAGNOSIS — M1710 Unilateral primary osteoarthritis, unspecified knee: Secondary | ICD-10-CM | POA: Diagnosis present

## 2019-09-27 DIAGNOSIS — F0390 Unspecified dementia without behavioral disturbance: Secondary | ICD-10-CM | POA: Diagnosis present

## 2019-09-27 DIAGNOSIS — M19019 Primary osteoarthritis, unspecified shoulder: Secondary | ICD-10-CM | POA: Diagnosis present

## 2019-09-27 DIAGNOSIS — Z20822 Contact with and (suspected) exposure to covid-19: Secondary | ICD-10-CM | POA: Diagnosis present

## 2019-09-27 DIAGNOSIS — E890 Postprocedural hypothyroidism: Secondary | ICD-10-CM | POA: Diagnosis present

## 2019-09-27 DIAGNOSIS — Z885 Allergy status to narcotic agent status: Secondary | ICD-10-CM

## 2019-09-27 DIAGNOSIS — Z888 Allergy status to other drugs, medicaments and biological substances status: Secondary | ICD-10-CM

## 2019-09-27 DIAGNOSIS — Z79899 Other long term (current) drug therapy: Secondary | ICD-10-CM

## 2019-09-27 DIAGNOSIS — Z7982 Long term (current) use of aspirin: Secondary | ICD-10-CM

## 2019-09-27 DIAGNOSIS — Z91013 Allergy to seafood: Secondary | ICD-10-CM

## 2019-09-27 DIAGNOSIS — Z87891 Personal history of nicotine dependence: Secondary | ICD-10-CM

## 2019-09-27 LAB — COMPREHENSIVE METABOLIC PANEL
ALT: 14 U/L (ref 0–44)
AST: 14 U/L — ABNORMAL LOW (ref 15–41)
Albumin: 3.9 g/dL (ref 3.5–5.0)
Alkaline Phosphatase: 130 U/L — ABNORMAL HIGH (ref 38–126)
Anion gap: 15 (ref 5–15)
BUN: 74 mg/dL — ABNORMAL HIGH (ref 8–23)
CO2: 16 mmol/L — ABNORMAL LOW (ref 22–32)
Calcium: 9.3 mg/dL (ref 8.9–10.3)
Chloride: 108 mmol/L (ref 98–111)
Creatinine, Ser: 3.81 mg/dL — ABNORMAL HIGH (ref 0.44–1.00)
GFR calc Af Amer: 11 mL/min — ABNORMAL LOW (ref >60)
GFR calc non Af Amer: 10 mL/min — ABNORMAL LOW (ref >60)
Glucose, Bld: 107 mg/dL — ABNORMAL HIGH (ref 70–99)
Potassium: 4 mmol/L (ref 3.5–5.1)
Sodium: 139 mmol/L (ref 135–145)
Total Bilirubin: 0.9 mg/dL (ref 0.3–1.2)
Total Protein: 7.2 g/dL (ref 6.5–8.1)

## 2019-09-27 LAB — CBC WITH DIFFERENTIAL/PLATELET
Abs Immature Granulocytes: 0.02 10*3/uL (ref 0.00–0.07)
Basophils Absolute: 0.1 10*3/uL (ref 0.0–0.1)
Basophils Relative: 1 %
Eosinophils Absolute: 0.3 10*3/uL (ref 0.0–0.5)
Eosinophils Relative: 3 %
HCT: 42.5 % (ref 36.0–46.0)
Hemoglobin: 14.1 g/dL (ref 12.0–15.0)
Immature Granulocytes: 0 %
Lymphocytes Relative: 33 %
Lymphs Abs: 2.6 10*3/uL (ref 0.7–4.0)
MCH: 29.7 pg (ref 26.0–34.0)
MCHC: 33.2 g/dL (ref 30.0–36.0)
MCV: 89.7 fL (ref 80.0–100.0)
Monocytes Absolute: 0.7 10*3/uL (ref 0.1–1.0)
Monocytes Relative: 9 %
Neutro Abs: 4.1 10*3/uL (ref 1.7–7.7)
Neutrophils Relative %: 54 %
Platelets: 80 10*3/uL — ABNORMAL LOW (ref 150–400)
RBC: 4.74 MIL/uL (ref 3.87–5.11)
RDW: 15.1 % (ref 11.5–15.5)
WBC: 7.7 10*3/uL (ref 4.0–10.5)
nRBC: 0 % (ref 0.0–0.2)

## 2019-09-27 NOTE — ED Notes (Signed)
Pt daughter called requesting an update on pt's lab results. This RN informed her that we are still waiting on labs to result and this RN will notify her once results are in.

## 2019-09-27 NOTE — ED Provider Notes (Signed)
Emergency Department Provider Note   I have reviewed the triage vital signs and the nursing notes.   HISTORY  Chief Complaint Abnormal Lab   HPI Rebecca Travis is a 86 y.o. female with PMH of HLD, HTN, and prior SDH presents emergency department for evaluation of abnormal lab from a routine check up today. She is coming from [facility].  She was apparently there for routine visit without any symptoms.  They performed blood work and the patient was called later and told that she was in "renal failure" and EMS was called for transport to the ED. patient denies any pain in her abdomen or back.  She is been urinating normally without discomfort, hesitancy, urgency.  She is been eating and drinking well recently.  She denies recalling any new medications. Labs did not arrive with the patient from SNF.  Level 5 caveat: Dementia   Past Medical History:  Diagnosis Date  . Arthritis    shoulder, knee  - otc med prn  . Cancer ([place])   . GERD (gastroesophageal reflux disease)   . Hyperlipidemia    diet controlled - no meds  . Hypertension   . Hypothyroidism   . Subdural hematoma ([place])   . SVD (spontaneous vaginal delivery)    x 3    Patient Active Problem List   Diagnosis Date Noted  . AKI (acute kidney injury) ([place]) 09/28/2019  . Risk for falls 11/28/2015  . 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 Dementia ([place]) 08/21/2015  . Tremor 08/21/2015  . Subdural hematoma ([place]) 03/25/2015  . Endometrial mass 09/15/2013    Past Surgical History:  Procedure Laterality Date  . BREAST SURGERY     right breast duct   . COLONOSCOPY    . CRANIOTOMY Left 03/25/2015   Procedure: CRANIOTOMY HEMATOMA EVACUATION SUBDURAL;  Surgeon: Consuella Lose, MD;  Location: [place] [facility] [facility];  Service: Neurosurgery;  Laterality: Left;  . HYSTEROSCOPY WITH D & C N/A 09/15/2013   Procedure: DILATATION AND CURETTAGE /HYSTEROSCOPY WITH UTERINE POLYP RESECTION;  Surgeon: Eldred Manges,  MD;  Location: [facility];  Service: Gynecology;  Laterality: N/A;  . TOOTH EXTRACTION    . TOTAL THYROIDECTOMY      Allergies Aricept [donepezil hcl], Benzonatate, Cefuroxime, Donepezil, Fish oil, Gabapentin, Statins, Ezetimibe, Loratadine, and Tramadol  Family History  Problem Relation Age of Onset  . Stroke Father   . Dementia Neg Hx     Social History Social History   Tobacco Use  . Smoking status: Former Smoker    Packs/day: 0.15    Types: Cigarettes    Quit date: 08/13/1951    Years since quitting: 68.1  . Smokeless tobacco: Never Used  Substance Use Topics  . Alcohol use: Yes    Alcohol/week: 0.0 standard drinks    Comment: 1 glass wine per week  . Drug use: No    Review of Systems  Constitutional: No fever/chills Eyes: No visual changes. ENT: No sore throat. Cardiovascular: Denies chest pain. Respiratory: Denies shortness of breath. Gastrointestinal: No abdominal pain.  No nausea, no vomiting.  No diarrhea.  No constipation. Genitourinary: Negative for dysuria. Musculoskeletal: Negative for back pain. Skin: Negative for rash. Neurological: Negative for headaches, focal weakness or numbness.  10-point ROS otherwise negative.  ____________________________________________   PHYSICAL EXAM:  VITAL SIGNS: ED Triage Vitals  Enc Vitals Group     BP 09/27/19 1822 105/72     Pulse Rate 09/27/19 1822 72     Resp  09/27/19 1822 16     Temp 09/27/19 1822 98.2 F (36.8 C)     Temp Source 09/27/19 1822 Oral     SpO2 09/27/19 1822 100 %     Weight 09/27/19 1821 158 lb (71.7 kg)     Height 09/27/19 1821 5' 7.5" (1.715 m)   Constitutional: Alert with mild confusion. Well appearing and in no acute distress. Eyes: Conjunctivae are normal. Head: Atraumatic. Nose: No congestion/rhinnorhea. Mouth/Throat: Mucous membranes are moist.  Neck: No stridor.   Cardiovascular: Normal rate, regular rhythm. Good peripheral circulation. Grossly normal heart sounds.   Respiratory:  Normal respiratory effort.  No retractions. Lungs CTAB. Gastrointestinal: Soft and nontender. No distention.  Musculoskeletal: No gross deformities of extremities. Neurologic:  Normal speech and language.  Skin:  Skin is warm, dry and intact. No rash noted.   ____________________________________________   LABS (all labs ordered are listed, but only abnormal results are displayed)  Labs Reviewed  CBC WITH DIFFERENTIAL/PLATELET - Abnormal; Notable for the following components:      Result Value   Platelets 80 (*)    All other components within normal limits  URINALYSIS, ROUTINE W REFLEX MICROSCOPIC - Abnormal; Notable for the following components:   APPearance CLOUDY (*)    Specific Gravity, Urine >1.030 (*)    Hgb urine dipstick MODERATE (*)    Protein, ur 30 (*)    Leukocytes,Ua MODERATE (*)    All other components within normal limits  COMPREHENSIVE METABOLIC PANEL - Abnormal; Notable for the following components:   CO2 16 (*)    Glucose, Bld 107 (*)    BUN 74 (*)    Creatinine, Ser 3.81 (*)    AST 14 (*)    Alkaline Phosphatase 130 (*)    GFR calc non Af Amer 10 (*)    GFR calc Af Amer 11 (*)    All other components within normal limits  URINALYSIS, MICROSCOPIC (REFLEX) - Abnormal; Notable for the following components:   Bacteria, UA FEW (*)    All other components within normal limits  SARS CORONAVIRUS 2 (TAT 6-24 HRS)  URINE CULTURE   ____________________________________________   PROCEDURES  Procedure(s) performed:   Procedures  None ____________________________________________   INITIAL IMPRESSION / ASSESSMENT AND PLAN / ED COURSE  Pertinent labs & imaging results that were available during my care of the patient were reviewed by me and considered in my medical decision making (see chart for details).   Patient presents to the emergency department for evaluation of abnormal lab from routine visit today.  Urinating normally per staff.  Patient denies any  symptoms.  Plan for repeat lab work and will reassess.   CBC reviewed. BMP and UA pending. Nurse to re-draw labs. Care transferred to Dr. Leonides Schanz.  ____________________________________________  FINAL CLINICAL IMPRESSION(S) / ED DIAGNOSES  Final diagnoses:  AKI (acute kidney injury) ([place])     MEDICATIONS GIVEN DURING THIS VISIT:  Medications  0.9 %  sodium chloride infusion ( Intravenous New Bag/Given 09/28/19 0252)  aspirin tablet 325 mg (325 mg Oral Given 09/28/19 0934)  rivastigmine (EXELON) 13.3 MG/24HR 13.3 mg (has no administration in time range)  levothyroxine (SYNTHROID) tablet 75 mcg (75 mcg Oral Given 09/28/19 0934)  amLODipine (NORVASC) tablet 2.5 mg (2.5 mg Oral Given 09/28/19 0935)  enoxaparin (LOVENOX) injection 30 mg (30 mg Subcutaneous Given 09/28/19 0932)  acetaminophen (TYLENOL) tablet 650 mg (has no administration in time range)    Or  acetaminophen (TYLENOL) suppository 650 mg (has no  administration in time range)  senna (SENOKOT) tablet 8.6 mg (8.6 mg Oral Given 09/28/19 0934)    Note:  This document was prepared using Dragon voice recognition software and may include unintentional dictation errors.  Nanda Quinton, MD, [facility] Emergency Medicine    Venetia Prewitt, [facility], MD 09/28/19 [phone]

## 2019-09-27 NOTE — ED Notes (Signed)
Lab called, Pt BMP hemolyzed. RN attempt to redraw unsuccessful. Phlebotomy requested to come draw.

## 2019-09-27 NOTE — ED Notes (Signed)
RN lab draw attempt unsuccessful, phlebotomy requested.

## 2019-09-27 NOTE — ED Triage Notes (Signed)
Pt to ED via [facility] from [facility] due to abnormal labs.  EMS reports staff told them that pt had a routine check up earlier today and was called and told pt's lab work showed renal failure  Pt denies any pain and does not have any complaints

## 2019-09-28 ENCOUNTER — Encounter (HOSPITAL_COMMUNITY): Payer: Self-pay | Admitting: Internal Medicine

## 2019-09-28 DIAGNOSIS — M1710 Unilateral primary osteoarthritis, unspecified knee: Secondary | ICD-10-CM | POA: Diagnosis present

## 2019-09-28 DIAGNOSIS — I10 Essential (primary) hypertension: Secondary | ICD-10-CM | POA: Diagnosis present

## 2019-09-28 DIAGNOSIS — M19019 Primary osteoarthritis, unspecified shoulder: Secondary | ICD-10-CM | POA: Diagnosis present

## 2019-09-28 DIAGNOSIS — Z885 Allergy status to narcotic agent status: Secondary | ICD-10-CM | POA: Diagnosis not present

## 2019-09-28 DIAGNOSIS — Z79899 Other long term (current) drug therapy: Secondary | ICD-10-CM | POA: Diagnosis not present

## 2019-09-28 DIAGNOSIS — N179 Acute kidney failure, unspecified: Principal | ICD-10-CM

## 2019-09-28 DIAGNOSIS — Z79890 Hormone replacement therapy (postmenopausal): Secondary | ICD-10-CM | POA: Diagnosis not present

## 2019-09-28 DIAGNOSIS — F0390 Unspecified dementia without behavioral disturbance: Secondary | ICD-10-CM | POA: Diagnosis present

## 2019-09-28 DIAGNOSIS — Z87891 Personal history of nicotine dependence: Secondary | ICD-10-CM | POA: Diagnosis not present

## 2019-09-28 DIAGNOSIS — E890 Postprocedural hypothyroidism: Secondary | ICD-10-CM | POA: Diagnosis present

## 2019-09-28 DIAGNOSIS — Z66 Do not resuscitate: Secondary | ICD-10-CM | POA: Diagnosis present

## 2019-09-28 DIAGNOSIS — Z20822 Contact with and (suspected) exposure to covid-19: Secondary | ICD-10-CM | POA: Diagnosis present

## 2019-09-28 DIAGNOSIS — Z91013 Allergy to seafood: Secondary | ICD-10-CM | POA: Diagnosis not present

## 2019-09-28 DIAGNOSIS — Z888 Allergy status to other drugs, medicaments and biological substances status: Secondary | ICD-10-CM | POA: Diagnosis not present

## 2019-09-28 DIAGNOSIS — Z7982 Long term (current) use of aspirin: Secondary | ICD-10-CM | POA: Diagnosis not present

## 2019-09-28 LAB — URINALYSIS, MICROSCOPIC (REFLEX)

## 2019-09-28 LAB — URINE CULTURE

## 2019-09-28 LAB — URINALYSIS, ROUTINE W REFLEX MICROSCOPIC
Bilirubin Urine: NEGATIVE
Glucose, UA: NEGATIVE mg/dL
Ketones, ur: NEGATIVE mg/dL
Nitrite: NEGATIVE
Protein, ur: 30 mg/dL — AB
Specific Gravity, Urine: >1.03 — ABNORMAL HIGH (ref 1.005–1.030)
pH: 5 (ref 5.0–8.0)

## 2019-09-28 LAB — BASIC METABOLIC PANEL
Anion gap: 10 (ref 5–15)
BUN: 67 mg/dL — ABNORMAL HIGH (ref 8–23)
CO2: 16 mmol/L — ABNORMAL LOW (ref 22–32)
Calcium: 8.3 mg/dL — ABNORMAL LOW (ref 8.9–10.3)
Chloride: 115 mmol/L — ABNORMAL HIGH (ref 98–111)
Creatinine, Ser: 2.61 mg/dL — ABNORMAL HIGH (ref 0.44–1.00)
GFR calc Af Amer: 18 mL/min — ABNORMAL LOW (ref >60)
GFR calc non Af Amer: 15 mL/min — ABNORMAL LOW (ref >60)
Glucose, Bld: 115 mg/dL — ABNORMAL HIGH (ref 70–99)
Potassium: 3.9 mmol/L (ref 3.5–5.1)
Sodium: 141 mmol/L (ref 135–145)

## 2019-09-28 LAB — SARS CORONAVIRUS 2 (TAT 6-24 HRS): SARS Coronavirus 2: NEGATIVE

## 2019-09-28 MED ORDER — AMLODIPINE BESYLATE 2.5 MG PO TABS
2.5000 mg | ORAL_TABLET | Freq: Every day | ORAL | Status: DC
  Administered 2019-09-28: 10:00:00 2.5 mg via ORAL
  Filled 2019-09-28: qty 1

## 2019-09-28 MED ORDER — LEVOTHYROXINE SODIUM 75 MCG PO TABS
75.0000 ug | ORAL_TABLET | Freq: Every day | ORAL | Status: DC
  Administered 2019-09-28 – 2019-10-01 (×3): 75 ug via ORAL
  Filled 2019-09-28 (×3): qty 1

## 2019-09-28 MED ORDER — RIVASTIGMINE 13.3 MG/24HR TD PT24
13.3000 mg | MEDICATED_PATCH | Freq: Every day | TRANSDERMAL | Status: DC
  Administered 2019-09-29 – 2019-10-01 (×3): 13.3 mg via TRANSDERMAL
  Filled 2019-09-28 (×4): qty 1

## 2019-09-28 MED ORDER — ACETAMINOPHEN 650 MG RE SUPP: 650.0000 mg | Freq: Four times a day (QID) | RECTAL | Status: DC | PRN

## 2019-09-28 MED ORDER — ACETAMINOPHEN 325 MG PO TABS
650.0000 mg | ORAL_TABLET | Freq: Four times a day (QID) | ORAL | Status: DC | PRN
  Administered 2019-09-28 – 2019-09-29 (×3): 650 mg via ORAL
  Filled 2019-09-28 (×3): qty 2

## 2019-09-28 MED ORDER — SENNA 8.6 MG PO TABS
1.0000 | ORAL_TABLET | Freq: Two times a day (BID) | ORAL | Status: DC
  Administered 2019-09-28 – 2019-10-01 (×6): 8.6 mg via ORAL
  Filled 2019-09-28 (×7): qty 1

## 2019-09-28 MED ORDER — ASPIRIN 325 MG PO TABS
325.0000 mg | ORAL_TABLET | Freq: Every day | ORAL | Status: DC
  Administered 2019-09-28 – 2019-10-01 (×4): 325 mg via ORAL
  Filled 2019-09-28 (×4): qty 1

## 2019-09-28 MED ORDER — SODIUM CHLORIDE 0.9 % IV SOLN: INTRAVENOUS | Status: DC

## 2019-09-28 MED ORDER — ENOXAPARIN SODIUM 30 MG/0.3ML ~~LOC~~ SOLN
30.0000 mg | Freq: Every day | SUBCUTANEOUS | Status: DC
  Administered 2019-09-28 – 2019-10-01 (×3): 30 mg via SUBCUTANEOUS
  Filled 2019-09-28 (×4): qty 0.3

## 2019-09-28 MED ORDER — SODIUM CHLORIDE 0.45 % IV SOLN: INTRAVENOUS | Status: DC

## 2019-09-28 NOTE — ED Provider Notes (Signed)
11;30 PM  Assumed care from Dr. Laverta Baltimore.  Patient is a 86 year old female with history of dementia who lives at [facility] assisted living facility who presents today for acute renal failure.  Unable to obtain labs from nursing facility.  Last labs we have for her were in 2017 with creatinine of 1.14.  Labs currently pending.  May need admission.  Patient currently without complaints.  12:25 AM  Spoke with daughter Joseph Art.  She reports that patient was more confused at the end of last week and so she took her to see her doctor at [place], Dr. Felipa Eth.  States that blood work was drawn on Friday, February 12 and she was called and told the patient had acute renal failure.  She was not told the actual creatinine value.  States they went back to the doctor on Monday, February 15 and had blood work redrawn and were called today and said that the creatinine was even worse and to come to the ED.  She states she was told by the PCP that patient's creatinine was normal in November.  She also was told by the primary care doctor that the patient had white blood cells in her urine.  She was not started on antibiotics.  Creatinine here is 3.81.  Will start gentle hydration in case this is prerenal.  Will discuss with hospitalist for admission.  Daughter has been updated by phone.  12:37 AM Discussed patient's case with hospitalist, Dr. Linda Hedges.  I have recommended admission and patient (and family if present) agree with this plan. Admitting physician will place admission orders.   I reviewed all nursing notes, vitals, pertinent previous records and interpreted all EKGs, lab and urine results, imaging (as available).     Emelly Wurtz, Delice Bison, DO 09/28/19 [phone]

## 2019-09-28 NOTE — Progress Notes (Signed)
Pt arrived to the unit, not in distress and tolerated well.

## 2019-09-28 NOTE — Progress Notes (Signed)
Subjective: Patient admitted this morning, see detailed H&P by Dr Linda Hedges 86 year old female with a history of dementia, hypertension who had marked increased confusion last week.  Lab work done by PCP showed elevated creatinine.  Patient was sent to the hospital for further evaluation.  She was started on fluid resuscitation.  Denies any complaints at this time.  Vitals:   09/28/19 1020 09/28/19 1051  BP:  123/90  Pulse: 80 72  Resp: 17 18  Temp:    SpO2: 100% 100%      A/P Acute kidney injury Hypertension Dementia  Patient is currently on IV normal saline at 125 mL/h.  Will cut down the rate to 75 mL/h.  Repeat BMP today and tomorrow. Continue amlodipine for hypertension Dementia is stable.  No behavior disturbance    [facility] [phone]

## 2019-09-28 NOTE — ED Notes (Signed)
Pt daughter called requesting another update. This RN gave her an update of pt status at this time. Pt daughter requesting an update from MD.

## 2019-09-28 NOTE — H&P (Signed)
History and Physical    Rebecca Travis [id number] DOB: 1924-10-20 DOA: 09/27/2019  PCP: Lajean Manes, MD (Confirm with patient/family/NH records and if not entered, this has to be entered at [facility] point of entry) Patient coming from: [place]  I have personally briefly reviewed patient's old medical records in [place]  Chief Complaint: acute rise in creatinine  HPI: Rebecca Travis is a 86 y.o. female with medical history significant of dementia, hypertension who per daughter's report had marked increase in confusion last week. She did not demonstrate any other symptoms. She saw Dr. Felipa Eth Friday, 09/24/19 where lab revealed rise in creatinine. No treatment was intiiated. She returned to Dr. Felipa Eth Monday, 2/15/121 and repeat lab revealed worsening renal function with further rise in creatinine. She was referred to ED for evaluation  (For level 3, the HPI must include 4+ descriptors: Location, Quality, Severity, Duration, Timing, Context, modifying factors, associated signs/symptoms and/or status of 3+ chronic problems.)  (Please avoid self-populating past medical history here) (The initial 2-3 lines should be focused and good to copy and paste in the HPI section of the daily progress note).  ED Course: In the ED the patient was hemodynamically stable. Lab revealed Cr 3.81. est GFR 11. U/A with sp gr >1.030, cloudy urine, + for LE, Micro with 6-10 WBC/hpf. Fluid resuscitation started. [facility] called to admit patient for continued hydration and evaluation.  Review of Systems: As per HPI otherwise 10 point review of systems negative.  Unacceptable ROS statements: "10 systems reviewed," "Extensive" (without elaboration).  Acceptable ROS statements: "All others negative," "All others reviewed and are negative," and "All others unremarkable," with at [place] documented Can't double dip - if using for HPI can't use for ROS  Past Medical History:  Diagnosis Date  . Arthritis     shoulder, knee  - otc med prn  . Cancer ([place])   . GERD (gastroesophageal reflux disease)   . Hyperlipidemia    diet controlled - no meds  . Hypertension   . Hypothyroidism   . Subdural hematoma ([place])   . SVD (spontaneous vaginal delivery)    x 3    Past Surgical History:  Procedure Laterality Date  . BREAST SURGERY     right breast duct   . COLONOSCOPY    . CRANIOTOMY Left 03/25/2015   Procedure: CRANIOTOMY HEMATOMA EVACUATION SUBDURAL;  Surgeon: Consuella Lose, MD;  Location: [place] [facility];  Service: Neurosurgery;  Laterality: Left;  . HYSTEROSCOPY WITH D & C N/A 09/15/2013   Procedure: DILATATION AND CURETTAGE /HYSTEROSCOPY WITH UTERINE POLYP RESECTION;  Surgeon: Eldred Manges, MD;  Location: [place] [facility];  Service: Gynecology;  Laterality: N/A;  . TOOTH EXTRACTION    . TOTAL THYROIDECTOMY     Soc Hx - was married for 14 years, divorced. She has two sons, 1 daughter, 6 grands, 2 great-grands. She was from [place] but moved [place] after her divorce. 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 She never remarried. Currently lives at [place]. She does have short term memory loss   reports that she quit smoking about 68 years ago. Her smoking use included cigarettes. She smoked 0.15 packs per day. She has never used smokeless tobacco. She reports current alcohol use. She reports that she does not use drugs.  Allergies  Allergen Reactions  . Aricept [Donepezil Hcl] Other (See Comments)    nightmares  . Benzonatate Diarrhea  . Cefuroxime Diarrhea  . Donepezil  Other (See Comments)    NIGHTMARES  . Fish Oil Diarrhea and Other (See Comments)    Other reaction(s): EXCESSIVE-DIARRHEA  . Gabapentin Other (See Comments)    Other reaction(s): DROWSINESS  . Statins Other (See Comments)    Other reaction(s): MYALGIA  . Ezetimibe Other (See Comments)  . Loratadine Other (See Comments)  . Tramadol Other (See Comments)    Family History   Problem Relation Age of Onset  . Stroke Father   . Dementia Neg Hx    Unacceptable: Noncontributory, unremarkable, or negative. Acceptable: Family history reviewed and not pertinent (If you reviewed it)  Prior to Admission medications   Medication Sig Start Date End Date Taking? Authorizing Provider  aspirin 325 MG tablet Take 325 mg by mouth daily.   Yes [provider]  cetaphil (CETAPHIL) lotion Apply 1 application topically daily. To legs and feet   Yes [provider]  Probiotic Product (PROBIOTIC DAILY PO) Take 1 capsule by mouth daily.   Yes [provider]  rivastigmine (EXELON) 13.3 MG/24HR Place 13.3 mg onto the skin daily.  02/07/17  Yes [provider]  SYNTHROID 50 MCG tablet Take 75 mcg by mouth daily.  01/30/15  Yes [provider]  amLODipine (NORVASC) 2.5 MG tablet Take 2.5 mg by mouth daily.  02/18/17   [provider]  lisinopril (PRINIVIL,ZESTRIL) 20 MG tablet Take 20 mg by mouth daily.     [provider]    Physical Exam: Vitals:   09/27/19 2215 09/27/19 2300 09/28/19 0140 09/28/19 0215  BP: 113/89 117/82 (!) 149/88 (!) 124/95  Pulse: 70 67 72 62  Resp: 19 16 19 16   Temp:   98.6 F (37 C)   TempSrc:   Oral   SpO2: 100% 98% 100% 100%  Weight:      Height:        Constitutional: NAD, calm, comfortable Vitals:   09/27/19 2215 09/27/19 2300 09/28/19 0140 09/28/19 0215  BP: 113/89 117/82 (!) 149/88 (!) 124/95  Pulse: 70 67 72 62  Resp: 19 16 19 16   Temp:   98.6 F (37 C)   TempSrc:   Oral   SpO2: 100% 98% 100% 100%  Weight:      Height:       General: Elderly woman in no distress Eyes: PERRL, lids and conjunctivae normal ENMT: Mucous membranes are moist. Posterior pharynx clear of any exudate or lesions.Normal dentition.  Neck: normal, supple, no masses, no thyromegaly Respiratory: clear to auscultation bilaterally, no wheezing, no crackles. Normal respiratory effort. No accessory muscle  use.  Cardiovascular: Regular rate and rhythm, no murmurs / rubs / gallops. No extremity edema. 2+ pedal pulses. No carotid bruits.  Abdomen: no tenderness, no masses palpated. No hepatosplenomegaly. Bowel sounds positive.  Musculoskeletal: no clubbing / cyanosis. No joint deformity upper and lower extremities. Good ROM, no contractures. Normal muscle tone.  Skin: no rashes, lesions, ulcers. No induration Neurologic: CN 2-12 grossly intact. Sensation intact Strength 4/5 in all 4.  Psychiatric: Normal judgment and insight. Poor short term memory and repeats herself otherwise cogent.. Alert and oriented x 3. Normal mood.   (Anything < 9 systems with 2 bullets each down codes to level 1) (If patient refuses exam can't bill higher level) (Make sure to document decubitus ulcers present on admission -- if possible -- and whether patient has chronic indwelling catheter at time of admission)  Labs on Admission: I have personally reviewed following labs and imaging studies  CBC:  Recent Labs  Lab 09/27/19 2056  WBC 7.7  NEUTROABS 4.1  HGB 14.1  HCT 42.5  MCV 89.7  PLT 80*   Basic Metabolic Panel: Recent Labs  Lab 09/27/19 2323  NA 139  K 4.0  CL 108  CO2 16*  GLUCOSE 107*  BUN 74*  CREATININE 3.81*  CALCIUM 9.3   GFR: Estimated Creatinine Clearance: 9.3 mL/min (A) (by C-G formula based on SCr of 3.81 mg/dL (H)). Liver Function Tests: Recent Labs  Lab 09/27/19 2323  AST 14*  ALT 14  ALKPHOS 130*  BILITOT 0.9  PROT 7.2  ALBUMIN 3.9   No results for input(s): LIPASE, AMYLASE in the last 168 hours. No results for input(s): AMMONIA in the last 168 hours. Coagulation Profile: No results for input(s): INR, PROTIME in the last 168 hours. Cardiac Enzymes: No results for input(s): CKTOTAL, CKMB, CKMBINDEX, TROPONINI in the last 168 hours. BNP (last 3 results) No results for input(s): PROBNP in the last 8760 hours. HbA1C: No results for input(s): HGBA1C in the last 72  hours. CBG: No results for input(s): GLUCAP in the last 168 hours. Lipid Profile: No results for input(s): CHOL, HDL, LDLCALC, TRIG, CHOLHDL, LDLDIRECT in the last 72 hours. Thyroid Function Tests: No results for input(s): TSH, T4TOTAL, FREET4, T3FREE, THYROIDAB in the last 72 hours. Anemia Panel: No results for input(s): VITAMINB12, FOLATE, FERRITIN, TIBC, IRON, RETICCTPCT in the last 72 hours. Urine analysis:    Component Value Date/Time   COLORURINE YELLOW 09/28/2019 0141   APPEARANCEUR CLOUDY (A) 09/28/2019 0141   LABSPEC >1.030 (H) 09/28/2019 0141   PHURINE 5.0 09/28/2019 0141   GLUCOSEU NEGATIVE 09/28/2019 0141   HGBUR MODERATE (A) 09/28/2019 0141   BILIRUBINUR NEGATIVE 09/28/2019 0141   KETONESUR NEGATIVE 09/28/2019 0141   PROTEINUR 30 (A) 09/28/2019 0141   UROBILINOGEN 0.2 03/25/2015 1325   NITRITE NEGATIVE 09/28/2019 0141   LEUKOCYTESUR MODERATE (A) 09/28/2019 0141    Radiological Exams on Admission: No results found.  EKG: Independently reviewed. No EKG to review  Assessment/Plan Active Problems:   AKI (acute kidney injury) ([place])   Dementia ([place])  (please populate well all problems here in Problem List. (For example, if patient is on BP meds at home and you resume or decide to hold them, it is a problem that needs to be her. Same for CAD, COPD, HLD and so on)   1. AKI- patient with new onset renal insufficiency. No UTI but hight sp.gr. No leukocytosis. Appears to be pre-renal azotemia. Plan Observation admit  Continue IVF hydration  F/u Bmet  For lack of improvement may need nephrology consult  2. HTN - by history. She recently stopped meds: ACE-I and CCB. BP in ED mildly elevated Plan Resume amlodipine daily  3. Dementia - stable. Very conversant. Good long term memory.   DVT prophylaxis: lovenox (Lovenox/Heparin/SCD's/anticoagulated/None (if comfort care) Code Status: DNR (Full/Partial (specify details) Family Communication: Spoke with daughter -  confirmed code status. Shared suspected dx of prerenal azotemia (Specify name, relationship. Do not write "discussed with patient". Specify tel # if discussed over the phone) Disposition Plan: [place] 48 hrs (specify when and where you expect patient to be discharged) Consults called: none (with names) Admission status: observation (inpatient / obs / tele / medical floor / SDU)   Adella Hare MD [facility] Pager [phone]  If 7PM-7AM, please contact night-coverage [email or web address] Password [facility]  09/28/2019, 2:55 AM

## 2019-09-28 NOTE — Plan of Care (Signed)
  Problem: Safety: Goal: Ability to remain free from injury will improve Outcome: Progressing   Problem: Skin Integrity: Goal: Risk for impaired skin integrity will decrease Outcome: Progressing   

## 2019-09-28 NOTE — ED Notes (Signed)
Pt removed self from cardiac monitor and removed IV. Pt placed back in bed, put back on cardiac monitor, and reoriented. Educated again on use of call light. Pt verbalized and demonstrated understanding.

## 2019-09-29 ENCOUNTER — Other Ambulatory Visit: Payer: Self-pay

## 2019-09-29 DIAGNOSIS — F0390 Unspecified dementia without behavioral disturbance: Secondary | ICD-10-CM

## 2019-09-29 LAB — BASIC METABOLIC PANEL
Anion gap: 12 (ref 5–15)
BUN: 58 mg/dL — ABNORMAL HIGH (ref 8–23)
CO2: 15 mmol/L — ABNORMAL LOW (ref 22–32)
Calcium: 8.4 mg/dL — ABNORMAL LOW (ref 8.9–10.3)
Chloride: 116 mmol/L — ABNORMAL HIGH (ref 98–111)
Creatinine, Ser: 1.67 mg/dL — ABNORMAL HIGH (ref 0.44–1.00)
GFR calc Af Amer: 30 mL/min — ABNORMAL LOW (ref >60)
GFR calc non Af Amer: 26 mL/min — ABNORMAL LOW (ref >60)
Glucose, Bld: 99 mg/dL (ref 70–99)
Potassium: 3.9 mmol/L (ref 3.5–5.1)
Sodium: 143 mmol/L (ref 135–145)

## 2019-09-29 MED ORDER — SODIUM CHLORIDE 0.9 % IV BOLUS
500.0000 mL | Freq: Once | INTRAVENOUS | Status: AC
  Administered 2019-09-29: 10:00:00 500 mL via INTRAVENOUS

## 2019-09-29 NOTE — Progress Notes (Signed)
Progress Note    Rebecca Travis  [id number] DOB: 1925-02-23  DOA: 09/27/2019 PCP: Lajean Manes, MD    Brief Narrative:     Medical records reviewed and are as summarized below:  Rebecca Travis is an 86 y.o. female with a history of dementia, hypertension who had marked increased confusion last week.  Lab work done by PCP showed elevated creatinine.  Patient was sent to the hospital for further evaluation.  She was started on fluid resuscitation.  Assessment/Plan:   Active Problems:   Dementia ([place])   AKI (acute kidney injury) ([place])   AKI - patient with new onset renal insufficiency -No UTI -Appears to be pre-renal azotemia (we will asked nurse to document how much patient is eating and drinking). -Continue IV fluids -BMP in a.m.  HTN - by history. -Patient's blood pressure is on the lower side of normal so will DC all blood pressure medications  Dementia  No issues with behavior       Family Communication/Anticipated D/C date and plan/Code Status   DVT prophylaxis: Lovenox ordered. Code Status: DNR Family Communication: Spoke with daughter on phone Disposition Plan: Plan for return to ALF after PT eval as well as resolution of acute kidney injury. Have asked nursing to document patient's p.o. intake as well   Medical Consultants:    None.     Subjective:   Patient states she is not very hungry   Objective:    Vitals:   09/28/19 1459 09/28/19 1500 09/28/19 1940 09/29/19 0756  BP: 119/79  112/73 118/88  Pulse: 71  76 83  Resp: 17  17 18   Temp: 98.7 F (37.1 C)  98.8 F (37.1 C) 98.5 F (36.9 C)  TempSrc:   Oral Oral  SpO2: (!) 80% 98% 100% 100%  Weight:      Height:        Intake/Output Summary (Last 24 hours) at 09/29/2019 1152 Last data filed at 09/29/2019 0957 Gross per 24 hour  Intake 2967.67 ml  Output --  Net 2967.67 ml   Filed Weights   09/27/19 1821  Weight: 71.7 kg    Exam: On side of bed, drinking  cranberry juice Alert and pleasant Regular rate and rhythm Skin dry Positive bowel sounds soft nontender No lower extremity edema  Data Reviewed:   I have personally reviewed following labs and imaging studies:  Labs: Labs show the following:   Basic Metabolic Panel: Recent Labs  Lab 09/27/19 2323 09/27/19 2323 09/28/19 1159 09/29/19 0258  NA 139  --  141 143  K 4.0   < > 3.9 3.9  CL 108  --  115* 116*  CO2 16*  --  16* 15*  GLUCOSE 107*  --  115* 99  BUN 74*  --  67* 58*  CREATININE 3.81*  --  2.61* 1.67*  CALCIUM 9.3  --  8.3* 8.4*   < > = values in this interval not displayed.   GFR Estimated Creatinine Clearance: 21.3 mL/min (A) (by C-G formula based on SCr of 1.67 mg/dL (H)). Liver Function Tests: Recent Labs  Lab 09/27/19 2323  AST 14*  ALT 14  ALKPHOS 130*  BILITOT 0.9  PROT 7.2  ALBUMIN 3.9   No results for input(s): LIPASE, AMYLASE in the last 168 hours. No results for input(s): AMMONIA in the last 168 hours. Coagulation profile No results for input(s): INR, PROTIME in the last 168 hours.  CBC: Recent Labs  Lab 09/27/19 2056  WBC 7.7  NEUTROABS 4.1  HGB 14.1  HCT 42.5  MCV 89.7  PLT 80*   Cardiac Enzymes: No results for input(s): CKTOTAL, CKMB, CKMBINDEX, TROPONINI in the last 168 hours. BNP (last 3 results) No results for input(s): PROBNP in the last 8760 hours. CBG: No results for input(s): GLUCAP in the last 168 hours. D-Dimer: No results for input(s): DDIMER in the last 72 hours. Hgb A1c: No results for input(s): HGBA1C in the last 72 hours. Lipid Profile: No results for input(s): CHOL, HDL, LDLCALC, TRIG, CHOLHDL, LDLDIRECT in the last 72 hours. Thyroid function studies: No results for input(s): TSH, T4TOTAL, T3FREE, THYROIDAB in the last 72 hours.  Invalid input(s): FREET3 Anemia work up: No results for input(s): VITAMINB12, FOLATE, FERRITIN, TIBC, IRON, RETICCTPCT in the last 72 hours. Sepsis Labs: Recent Labs  Lab  09/27/19 2056  WBC 7.7    Microbiology Recent Results (from the past 240 hour(s))  SARS CORONAVIRUS 2 (TAT 6-24 HRS) Nasopharyngeal Nasopharyngeal Swab     Status: None   Collection Time: 09/28/19  1:37 AM   Specimen: Nasopharyngeal Swab  Result Value Ref Range Status   SARS Coronavirus 2 NEGATIVE NEGATIVE Final    Comment: (NOTE) SARS-CoV-2 target nucleic acids are NOT DETECTED. The SARS-CoV-2 RNA is generally detectable in upper and lower respiratory specimens during the acute phase of infection. Negative results do not preclude SARS-CoV-2 infection, do not rule out co-infections with other pathogens, and should not be used as the sole basis for treatment or other patient management decisions. Negative results must be combined with clinical observations, patient history, and epidemiological information. The expected result is Negative. Fact Sheet for Patients: SugarRoll.be Fact Sheet for Healthcare Providers: https://www.woods-mathews.com/ This test is not yet approved or cleared by the [place] FDA and  has been authorized for detection and/or diagnosis of SARS-CoV-2 by FDA under an Emergency Use Authorization (EUA). This EUA will remain  in effect (meaning this test can be used) for the duration of the COVID-19 declaration under Section 56 4(b)(1) of the Act, 21 U.S.C. section 360bbb-3(b)(1), unless the authorization is terminated or revoked sooner. Performed at [facility], [place] [address], [place], [place] [address]   Urine culture     Status: Abnormal   Collection Time: 09/28/19  1:41 AM   Specimen: Urine, Clean Catch  Result Value Ref Range Status   Specimen Description URINE, CLEAN CATCH  Final   Special Requests   Final    NONE Performed at [facility], [place] [address] [place], [place] [address]    Culture MULTIPLE SPECIES PRESENT, SUGGEST RECOLLECTION (A)  Final   Report Status 09/28/2019 FINAL  Final      Procedures and diagnostic studies:  No results found.  Medications:   . aspirin  325 mg Oral Daily  . enoxaparin (LOVENOX) injection  30 mg Subcutaneous Daily  . levothyroxine  75 mcg Oral Daily  . rivastigmine  13.3 mg Transdermal Daily  . senna  1 tablet Oral BID   Continuous Infusions:   LOS: 1 day   Geradine Girt  [facility]   How to contact the [facility] Attending or Consulting provider [place] or covering provider during after hours [place], for this patient?  1. Check the care team in [facility] and look for a) attending/consulting TRH provider listed and b) the [facility] team listed 2. Log into [email or web address] and use [place]'s universal password to access. If you do not have the  password, please contact the hospital operator. 3. Locate the [facility] provider you are looking for under [facility] and page to a number that you can be directly reached. 4. If you still have difficulty reaching the provider, please page the [facility] (Director on Call) for the Hospitalists listed on amion for assistance.  09/29/2019, 11:52 AM

## 2019-09-29 NOTE — Progress Notes (Signed)
Patient suffers from BLE weakness, instability which impairs their ability to perform daily activities like ambulation, ADLs in the home.  A walker alone will not resolve the issues with performing activities of daily living. A wheelchair will allow patient to safely perform daily activities.  The patient can self propel in the home or has a caregiver who can provide assistance.     Reuel Derby, PT, DPT  Acute Rehabilitation Services  Pager: [phone] Office: [phone]

## 2019-09-29 NOTE — Plan of Care (Signed)
  Problem: Safety: Goal: Ability to remain free from injury will improve Outcome: Progressing   Problem: Skin Integrity: Goal: Risk for impaired skin integrity will decrease Outcome: Progressing   

## 2019-09-29 NOTE — Evaluation (Signed)
Physical Therapy Evaluation Patient Details Name: Rebecca Travis MRN: [id number] DOB: [birth year] Today's Date: 09/29/2019   History of Present Illness  Pt is a 86 y/o female admitted secondary to abnormal lab values and AKI. PMH includes dementia and HTN.   Clinical Impression  Pt admitted secondary to problem above with deficits below. Pt requiring max A to stand secondary to BLE weakness and shakiness. Per pt, she was independent with ambulation using rollator prior to admission. Per pt's daughter, pt from ALF and ALF staff able to provide necessary assist for mobility at d/c. Recommend using WC at ALF initially to increase safety with mobility. Also recommend PT follow up upon return. Will continue to follow acutely to maximize functional mobility independence and safety.     Follow Up Recommendations Home health PT;Supervision for mobility/OOB(return to ALF with PT services)    Equipment Recommendations  Wheelchair (measurements PT);Wheelchair cushion (measurements PT)    Recommendations for Other Services       Precautions / Restrictions Precautions Precautions: Fall Restrictions Weight Bearing Restrictions: No      Mobility  Bed Mobility Overal bed mobility: Needs Assistance Bed Mobility: Supine to Sit     Supine to sit: Min assist     General bed mobility comments: Min A for trunk assist.   Transfers Overall transfer level: Needs assistance Equipment used: Rolling walker (2 wheeled) Transfers: Sit to/from Stand Sit to Stand: Max assist         General transfer comment: Pt performed sit<>Stand X2. Required max A for lift assist and steadying. Pt very shaky upon standing, so unable to perform further mobility safely with +1 assist.   Ambulation/Gait                Stairs            Wheelchair Mobility    Modified Rankin (Stroke Patients Only)       Balance Overall balance assessment: Needs assistance Sitting-balance support: No upper  extremity supported;Feet supported Sitting balance-Leahy Scale: Good     Standing balance support: Bilateral upper extremity supported;During functional activity Standing balance-Leahy Scale: Poor Standing balance comment: Pt requiring UE and external support to stand.                              Pertinent Vitals/Pain Pain Assessment: No/denies pain    Home Living Family/patient expects to be discharged to:: Assisted living               Home Equipment: [place] - 4 wheels      Prior Function Level of Independence: Independent with assistive device(s)         Comments: From ALF, however, reports she was independent with ambulation using rollator and was independent with ADL tasks.      Hand Dominance        Extremity/Trunk Assessment   Upper Extremity Assessment Upper Extremity Assessment: Generalized weakness    Lower Extremity Assessment Lower Extremity Assessment: Generalized weakness    Cervical / Trunk Assessment Cervical / Trunk Assessment: Normal  Communication   Communication: No difficulties  Cognition Arousal/Alertness: Awake/alert Behavior During Therapy: WFL for tasks assessed/performed Overall Cognitive Status: History of cognitive impairments - at baseline                                 General Comments: Dementia at baseline  General Comments General comments (skin integrity, edema, etc.): Pt's daughter present during session. Discussed extra support needed at d/c, and daughter reports facility can provide necessary support. Also educated about need to use WC to increase safety and pt and family agreeable.     Exercises     Assessment/Plan    PT Assessment Patient needs continued PT services  PT Problem List Decreased strength;Decreased balance;Decreased mobility;Decreased cognition;Decreased knowledge of use of DME       PT Treatment Interventions Gait training;Functional mobility training;Therapeutic  activities;Therapeutic exercise;Balance training;Patient/family education;Cognitive remediation;DME instruction    PT Goals (Current goals can be found in the Care Plan section)  Acute Rehab PT Goals Patient Stated Goal: for pt to return to ALF PT Goal Formulation: With patient/family Time For Goal Achievement: 10/13/19 Potential to Achieve Goals: Good    Frequency Min 3X/week   Barriers to discharge        Co-evaluation               AM-PAC PT "6 Clicks" Mobility  Outcome Measure Help needed turning from your back to your side while in a flat bed without using bedrails?: A Little Help needed moving from lying on your back to sitting on the side of a flat bed without using bedrails?: A Little Help needed moving to and from a bed to a chair (including a wheelchair)?: Total Help needed standing up from a chair using your arms (e.g., wheelchair or bedside chair)?: Total Help needed to walk in hospital room?: Total Help needed climbing 3-5 steps with a railing? : Total 6 Click Score: 10    End of Session Equipment Utilized During Treatment: Gait belt Activity Tolerance: Patient tolerated treatment well Patient left: in bed;with call bell/phone within reach;with bed alarm set;with family/visitor present;Other (comment)(sitting EOB ) Nurse Communication: Mobility status PT Visit Diagnosis: Unsteadiness on feet (R26.81);Muscle weakness (generalized) (M62.81)    Time: [id number] PT Time Calculation (min) (ACUTE ONLY): 23 min   Charges:   PT Evaluation $PT Eval Moderate Complexity: 1 Mod PT Treatments $Therapeutic Activity: 8-22 mins        Lou Miner, DPT  Acute Rehabilitation Services  Pager: [phone] Office: [phone]   Rebecca Travis 09/29/2019, 1:18 PM

## 2019-09-30 LAB — CBC
HCT: 33.4 % — ABNORMAL LOW (ref 36.0–46.0)
Hemoglobin: 11 g/dL — ABNORMAL LOW (ref 12.0–15.0)
MCH: 29.3 pg (ref 26.0–34.0)
MCHC: 32.9 g/dL (ref 30.0–36.0)
MCV: 88.8 fL (ref 80.0–100.0)
Platelets: 207 10*3/uL (ref 150–400)
RBC: 3.76 MIL/uL — ABNORMAL LOW (ref 3.87–5.11)
RDW: 14.6 % (ref 11.5–15.5)
WBC: 6.7 10*3/uL (ref 4.0–10.5)
nRBC: 0 % (ref 0.0–0.2)

## 2019-09-30 LAB — BASIC METABOLIC PANEL
Anion gap: 8 (ref 5–15)
BUN: 37 mg/dL — ABNORMAL HIGH (ref 8–23)
CO2: 16 mmol/L — ABNORMAL LOW (ref 22–32)
Calcium: 8.3 mg/dL — ABNORMAL LOW (ref 8.9–10.3)
Chloride: 119 mmol/L — ABNORMAL HIGH (ref 98–111)
Creatinine, Ser: 1.27 mg/dL — ABNORMAL HIGH (ref 0.44–1.00)
GFR calc Af Amer: 42 mL/min — ABNORMAL LOW (ref >60)
GFR calc non Af Amer: 37 mL/min — ABNORMAL LOW (ref >60)
Glucose, Bld: 105 mg/dL — ABNORMAL HIGH (ref 70–99)
Potassium: 3.5 mmol/L (ref 3.5–5.1)
Sodium: 143 mmol/L (ref 135–145)

## 2019-09-30 MED ORDER — AMLODIPINE BESYLATE 2.5 MG PO TABS
2.5000 mg | ORAL_TABLET | Freq: Every day | ORAL | Status: DC
  Administered 2019-09-30 – 2019-10-01 (×2): 2.5 mg via ORAL
  Filled 2019-09-30 (×2): qty 1

## 2019-09-30 NOTE — Progress Notes (Signed)
Progress Note    [name]  [id number] DOB: Feb 19, 1925  DOA: 09/27/2019 PCP: Lajean Manes, MD    Brief Narrative:     Medical records reviewed and are as summarized below:  Rebecca Travis is an 86 y.o. female with a history of dementia, hypertension who had marked increased confusion last week.  Lab work done by PCP showed elevated creatinine.  Patient was sent to the hospital for further evaluation.  She was started on fluid resuscitation.  Assessment/Plan:   Active Problems:   Dementia ([place])   AKI (acute kidney injury) ([place])   AKI - patient with new onset renal insufficiency -No UTI -Appears to be pre-renal azotemia (we will asked nurse to document how much patient is eating and drinking). -Creatinine continues to improve off of IV fluids  HTN - by history. -Blood pressure finally at the point where a low-dose Norvasc can be readded  Dementia  No issues with behavior      Await TOC consult for wheelchair as well as home health to be arranged and return back to ALF.  At this point it appears the earliest this will happen would be tomorrow  Family Communication/Anticipated D/C date and plan/Code Status   DVT prophylaxis: Lovenox ordered. Code Status: DNR Family Communication: Spoke with daughter on phone Disposition Plan: Plan for return to ALF-await TOC to arrange home health as well as wheelchair  Medical Consultants:    None.     Subjective:   Feels like she is hungry this morning   Objective:    Vitals:   09/29/19 2001 09/30/19 0351 09/30/19 0900 09/30/19 1231  BP: 130/73 135/84 (!) 156/80 (!) 145/75  Pulse: 75 74 64 78  Resp: 20 18  18   Temp: 98.7 F (37.1 C) 98.7 F (37.1 C) 98.1 F (36.7 C) 98.8 F (37.1 C)  TempSrc:   Oral Oral  SpO2: 100% 100% 98% 100%  Weight:      Height:        Intake/Output Summary (Last 24 hours) at 09/30/2019 1322 Last data filed at 09/29/2019 1700 Gross per 24 hour  Intake 240 ml   Output --  Net 240 ml   Filed Weights   09/27/19 1821  Weight: 71.7 kg    Exam: In bed, getting set up for breakfast Regular rate and rhythm Positive bowel sounds soft nontender Pleasant and cooperative  Data Reviewed:   I have personally reviewed following labs and imaging studies:  Labs: Labs show the following:   Basic Metabolic Panel: Recent Labs  Lab 09/27/19 2323 09/27/19 2323 09/28/19 1159 09/28/19 1159 09/29/19 0258 09/30/19 0133  NA 139  --  141  --  143 143  K 4.0   < > 3.9   < > 3.9 3.5  CL 108  --  115*  --  116* 119*  CO2 16*  --  16*  --  15* 16*  GLUCOSE 107*  --  115*  --  99 105*  BUN 74*  --  67*  --  58* 37*  CREATININE 3.81*  --  2.61*  --  1.67* 1.27*  CALCIUM 9.3  --  8.3*  --  8.4* 8.3*   < > = values in this interval not displayed.   GFR Estimated Creatinine Clearance: 28 mL/min (A) (by C-G formula based on SCr of 1.27 mg/dL (H)). Liver Function Tests: Recent Labs  Lab 09/27/19 2323  AST 14*  ALT 14  ALKPHOS 130*  BILITOT  0.9  PROT 7.2  ALBUMIN 3.9   No results for input(s): LIPASE, AMYLASE in the last 168 hours. No results for input(s): AMMONIA in the last 168 hours. Coagulation profile No results for input(s): INR, PROTIME in the last 168 hours.  CBC: Recent Labs  Lab 09/27/19 2056 09/30/19 0133  WBC 7.7 6.7  NEUTROABS 4.1  --   HGB 14.1 11.0*  HCT 42.5 33.4*  MCV 89.7 88.8  PLT 80* 207   Cardiac Enzymes: No results for input(s): CKTOTAL, CKMB, CKMBINDEX, TROPONINI in the last 168 hours. BNP (last 3 results) No results for input(s): PROBNP in the last 8760 hours. CBG: No results for input(s): GLUCAP in the last 168 hours. D-Dimer: No results for input(s): DDIMER in the last 72 hours. Hgb A1c: No results for input(s): HGBA1C in the last 72 hours. Lipid Profile: No results for input(s): CHOL, HDL, LDLCALC, TRIG, CHOLHDL, LDLDIRECT in the last 72 hours. Thyroid function studies: No results for input(s): TSH,  T4TOTAL, T3FREE, THYROIDAB in the last 72 hours.  Invalid input(s): FREET3 Anemia work up: No results for input(s): VITAMINB12, FOLATE, FERRITIN, TIBC, IRON, RETICCTPCT in the last 72 hours. Sepsis Labs: Recent Labs  Lab 09/27/19 2056 09/30/19 0133  WBC 7.7 6.7    Microbiology Recent Results (from the past 240 hour(s))  SARS CORONAVIRUS 2 (TAT 6-24 HRS) Nasopharyngeal Nasopharyngeal Swab     Status: None   Collection Time: 09/28/19  1:37 AM   Specimen: Nasopharyngeal Swab  Result Value Ref Range Status   SARS Coronavirus 2 NEGATIVE NEGATIVE Final    Comment: (NOTE) SARS-CoV-2 target nucleic acids are NOT DETECTED. The SARS-CoV-2 RNA is generally detectable in upper and lower respiratory specimens during the acute phase of infection. Negative results do not preclude SARS-CoV-2 infection, do not rule out co-infections with other pathogens, and should not be used as the sole basis for treatment or other patient management decisions. Negative results must be combined with clinical observations, patient history, and epidemiological information. The expected result is Negative. Fact Sheet for Patients: SugarRoll.be Fact Sheet for Healthcare Providers: https://www.woods-mathews.com/ This test is not yet approved or cleared by the [place] FDA and  has been authorized for detection and/or diagnosis of SARS-CoV-2 by FDA under an Emergency Use Authorization (EUA). This EUA will remain  in effect (meaning this test can be used) for the duration of the COVID-19 declaration under Section 56 4(b)(1) of the Act, 21 U.S.C. section 360bbb-3(b)(1), unless the authorization is terminated or revoked sooner. Performed at [facility], [place] [address], [place], [place] [address]   Urine culture     Status: Abnormal   Collection Time: 09/28/19  1:41 AM   Specimen: Urine, Clean Catch  Result Value Ref Range Status   Specimen Description URINE,  CLEAN CATCH  Final   Special Requests   Final    NONE Performed at [facility], [place] [address], [place], [place] [address]    Culture MULTIPLE SPECIES PRESENT, SUGGEST RECOLLECTION (A)  Final   Report Status 09/28/2019 FINAL  Final    Procedures and diagnostic studies:  No results found.  Medications:   . amLODipine  2.5 mg Oral Daily  . aspirin  325 mg Oral Daily  . enoxaparin (LOVENOX) injection  30 mg Subcutaneous Daily  . levothyroxine  75 mcg Oral Daily  . rivastigmine  13.3 mg Transdermal Daily  . senna  1 tablet Oral BID   Continuous Infusions:   LOS: 2 days  Geradine Girt  [facility]   How to contact the [facility] Attending or Consulting provider Page or covering provider during after hours [place], for this patient?  1. Check the care team in [facility] and look for a) attending/consulting TRH provider listed and b) the [facility] team listed 2. Log into [email or web address] and use [place]'s universal password to access. If you do not have the password, please contact the hospital operator. 3. Locate the [facility] provider you are looking for under [facility] and page to a number that you can be directly reached. 4. If you still have difficulty reaching the provider, please page the [facility] (Director on Call) for the Hospitalists listed on amion for assistance.  09/30/2019, 1:22 PM

## 2019-09-30 NOTE — Progress Notes (Signed)
Physical Therapy Treatment Patient Details Name: Rebecca Travis MRN: [id number] DOB: 10/14/1924 Today's Date: 09/30/2019    History of Present Illness Pt is a 86 y/o female admitted secondary to abnormal lab values and AKI. PMH includes dementia and HTN.     PT Comments    Pt with good mobility progression today, tolerating x4 sit to stands with less physical assist, and stand pivot x2 to and from [facility]. Pt with whole body trembling, suspect secondary to weakness and anxiety about mobility, during mobility but pt safe throughout transfers. Pt requiring assist with pericare post-BM, unsure of pt baseline with ADL activities. PT to continue to follow acutely, will progress mobility as tolerated.    Follow Up Recommendations  Home health PT;Supervision for mobility/OOB(return to ALF with PT services)     Equipment Recommendations  Wheelchair (measurements PT);Wheelchair cushion (measurements PT)    Recommendations for Other Services       Precautions / Restrictions Precautions Precautions: Fall Restrictions Weight Bearing Restrictions: No    Mobility  Bed Mobility Overal bed mobility: Needs Assistance Bed Mobility: Supine to Sit     Supine to sit: Min assist     General bed mobility comments: min assist for trunk elevation, scooting to EOB. Increased time with use of bedrails to complete.  Transfers Overall transfer level: Needs assistance Equipment used: Rolling walker (2 wheeled) Transfers: Sit to/from [facility] Sit to Stand: Mod assist Stand pivot transfers: Mod assist;+2 safety/equipment;From elevated surface       General transfer comment: Mod assist for sit to stand for power up, steadying, hip extension to neutral. Pt with UE and LE trembling upon standing, pt reporting feeling weak. Sit to stand x4 total, from EOB, BSC x2, and recliner for placement of chair pad. Mod assist +2 for stand pivot to/from [facility] for steadying, guiding pt to  destination surface, and physically maneuvering walker. Multimodal cuing for safety with RW.  Ambulation/Gait             General Gait Details: unable this session, fatigue   Stairs             Wheelchair Mobility    Modified Rankin (Stroke Patients Only)       Balance Overall balance assessment: Needs assistance Sitting-balance support: No upper extremity supported;Feet supported Sitting balance-Leahy Scale: Good     Standing balance support: Bilateral upper extremity supported;During functional activity Standing balance-Leahy Scale: Poor Standing balance comment: reliant on external support of RW and PT                            Cognition Arousal/Alertness: Awake/alert Behavior During Therapy: WFL for tasks assessed/performed Overall Cognitive Status: History of cognitive impairments - at baseline                                 General Comments: Dementia at baseline      Exercises General Exercises - Lower Extremity Hip Flexion/Marching: AROM;Both;10 reps;Standing    General Comments        Pertinent Vitals/Pain Pain Assessment: No/denies pain    Home Living                      Prior Function            PT Goals (current goals can now be found in the care plan section) Acute Rehab PT  Goals Patient Stated Goal: for pt to return to ALF PT Goal Formulation: With patient/family Time For Goal Achievement: 10/13/19 Potential to Achieve Goals: Good Progress towards PT goals: Progressing toward goals    Frequency    Min 3X/week      PT Plan Current plan remains appropriate    Co-evaluation              AM-PAC PT "6 Clicks" Mobility   Outcome Measure  Help needed turning from your back to your side while in a flat bed without using bedrails?: A Little Help needed moving from lying on your back to sitting on the side of a flat bed without using bedrails?: A Little Help needed moving to and from  a bed to a chair (including a wheelchair)?: A Lot Help needed standing up from a chair using your arms (e.g., wheelchair or bedside chair)?: A Lot Help needed to walk in hospital room?: Total Help needed climbing 3-5 steps with a railing? : Total 6 Click Score: 12    End of Session Equipment Utilized During Treatment: Gait belt Activity Tolerance: Patient tolerated treatment well;Patient limited by fatigue Patient left: with call bell/phone within reach;in chair;with chair alarm set;with nursing/sitter in room(sitting EOB ) Nurse Communication: Mobility status PT Visit Diagnosis: Unsteadiness on feet (R26.81);Muscle weakness (generalized) (M62.81)     Time: [id number] PT Time Calculation (min) (ACUTE ONLY): 20 min  Charges:  $Therapeutic Activity: 8-22 mins                     Aldeen Riga E, PT Acute Rehabilitation Services Pager [phone]  Office [phone]   [facility] D Filomeno Cromley 09/30/2019, 10:05 AM

## 2019-10-01 LAB — VITAMIN B12: Vitamin B-12: 262 pg/mL (ref 180–914)

## 2019-10-01 MED ORDER — SENNA 8.6 MG PO TABS: 1.0000 | ORAL_TABLET | Freq: Two times a day (BID) | ORAL | 0 refills | Status: DC

## 2019-10-01 MED ORDER — VITAMIN B-12 1000 MCG PO TABS
1000.0000 ug | ORAL_TABLET | Freq: Every day | ORAL | Status: DC
  Administered 2019-10-01: 16:00:00 1000 ug via ORAL
  Filled 2019-10-01: qty 1

## 2019-10-01 MED ORDER — CYANOCOBALAMIN 1000 MCG PO TABS: 1000.0000 ug | ORAL_TABLET | Freq: Every day | ORAL | 1 refills | Status: AC

## 2019-10-01 NOTE — TOC Initial Note (Signed)
Transition of Care [facility]) - Initial/Assessment Note    Patient Details  Name: Rebecca Travis MRN: [id number] Date of Birth: 28-Sep-1924  Transition of [facility]) CM/SW Contact:    Sable Feil, LCSW Phone Number: 10/01/2019, 3:46 PM  Clinical Narrative:  Talked with daughter Rebecca Travis [id number]) and confirmed patient from [facility] ALF and returning there. Daughter requested a wheelchair, aide and PT, and these orders were placed. Clinicals transmitted to facility, reviewed and ok'd by Alisa Graff, Publishing copy. Daughter initially wanted transport arranged for her mom to go in a wheelchair Lucianne Lei, however was advised that this transport would be after 6 pm. Daughter eventually requested ambulance transport.                Expected Discharge Plan: Assisted Living Barriers to Discharge: No Barriers Identified   Patient Goals and CMS Choice        Expected Discharge Plan and Services Expected Discharge Plan: Assisted Living         Expected Discharge Date: 10/01/19               DME Arranged: Youth worker wheelchair with seat cushion DME Agency: [employer] Date DME Agency Contacted: 10/01/19 Time DME Agency Contacted: 1112 Representative spoke with at DME Agency: [place]: PT, Nurse's Aide [place] Agency: (Per [facility] [employer] [facility]F)        Prior Living Arrangements/Services                       Activities of Daily Living Home Assistive Devices/Equipment: Environmental consultant (specify type) ADL Screening (condition at time of admission) Patient's cognitive ability adequate to safely complete daily activities?: Yes Is the patient deaf or have difficulty hearing?: No Does the patient have difficulty seeing, even when wearing glasses/contacts?: No Does the patient have difficulty concentrating, remembering, or making decisions?: Yes Patient able to express need for assistance with ADLs?: Yes Does the patient have difficulty dressing or bathing?:  No Independently performs ADLs?: Yes (appropriate for developmental age) Does the patient have difficulty walking or climbing stairs?: Yes Weakness of Legs: None Weakness of Arms/Hands: None  Permission Sought/Granted                  Emotional Assessment              Admission diagnosis:  AKI (acute kidney injury) ([place]) [N17.9] Patient Active Problem List   Diagnosis Date Noted  . AKI (acute kidney injury) ([place]) 09/28/2019  . Risk for falls 11/28/2015  . 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 Dementia ([place]) 08/21/2015  . Tremor 08/21/2015  . Subdural hematoma ([place]) 03/25/2015  . Endometrial mass 09/15/2013   PCP:  Lajean Manes, MD Pharmacy:   [id number] - [place], [place] - [address] AT [place] [address] [place] [address] Phone: [phone] Fax: [phone]  [facility] [place], [address] [place] [place] [address] Phone: [phone] Fax: [phone]     Social Determinants of Health (SDOH) Interventions  No SDOH interventions needed or requested.   Readmission Risk Interventions No flowsheet data found.

## 2019-10-01 NOTE — Discharge Summary (Signed)
Physician Discharge Summary  Rebecca Travis [id number] DOB: Mar 12, 1925 DOA: 09/27/2019  PCP: Lajean Manes, MD  Admit date: 09/27/2019 Discharge date: 10/01/2019  Admitted From: Assisted living Disposition: Assisted living facility with home health PT/home aide  Recommendations for Outpatient Follow-up:  1. Follow up with PCP in 1-2 week.  Will need BMP in one week 2. Lisinopril held at the time of discharge due to normal blood pressure.  Monitor BP and restart as deemed appropriate. 3. Vitamin B12 low at 262.  Started on oral daily supplements.   Home Health: Yes Equipment/Devices: Wheelchair  Discharge Condition: Fair CODE STATUS: DNR Diet recommendation: Cardiac diet  Brief/Interim Summary: 86 year old African-American female with PMH of dementia, hypertension presented from assisted living facility with worsening mental status.  Lab work done by PCP showed elevated creatinine and she was sent to the hospital for further evaluation.  Creatinine 3.81 on admission markedly improved with IV fluids and thought to be prerenal.  Lisinopril was held in view of AKI not restarted prior to admission.  UA showed moderate leukocytes but urine culture showed multiple species not consistent with a UTI.  Worked with physical therapy who recommended home health PT and a wheelchair.  Remained hemodynamically stable throughout the hospital stay.  Discharge Diagnoses:  Active Problems:   Dementia ([place])   AKI (acute kidney injury) [facility])   Discharge Instructions:  Discharge Instructions    Diet - low sodium heart healthy   Complete by: As directed    Increase activity slowly   Complete by: As directed      Allergies as of 10/01/2019      Reactions   Aricept [donepezil Hcl] Other (See Comments)   nightmares   Benzonatate Diarrhea   Cefuroxime Diarrhea   Donepezil Other (See Comments)   NIGHTMARES   Fish Oil Diarrhea, Other (See Comments)   Other reaction(s): EXCESSIVE-DIARRHEA   Gabapentin Other (See Comments)   Other reaction(s): DROWSINESS   Statins Other (See Comments)   Other reaction(s): MYALGIA   Ezetimibe Other (See Comments)   Loratadine Other (See Comments)   Tramadol Other (See Comments)      Medication List    STOP taking these medications   lisinopril 20 MG tablet Commonly known as: ZESTRIL     TAKE these medications   amLODipine 2.5 MG tablet Commonly known as: NORVASC Take 2.5 mg by mouth daily.   aspirin 325 MG tablet Take 325 mg by mouth daily.   cetaphil lotion Apply 1 application topically daily. To legs and feet   cyanocobalamin 1000 MCG tablet Take 1 tablet (1,000 mcg total) by mouth daily.   PROBIOTIC DAILY PO Take 1 capsule by mouth daily.   rivastigmine 13.3 MG/24HR Commonly known as: EXELON Place 13.3 mg onto the skin daily.   senna 8.6 MG Tabs tablet Commonly known as: SENOKOT Take 1 tablet (8.6 mg total) by mouth 2 (two) times daily.   Synthroid 50 MCG tablet Generic drug: levothyroxine Take 75 mcg by mouth daily.            Durable Medical Equipment  (From admission, onward)         Start     Ordered   09/29/19 1306  For home use only DME lightweight manual wheelchair with seat cushion  Once    Comments: Patient suffers from weakness which impairs their ability to perform daily activities like toileting in the home.  A cane will not resolve  issue with performing activities of daily living. A wheelchair  will allow patient to safely perform daily activities. Patient is not able to propel themselves in the home using a standard weight wheelchair due to general weakness. Patient can self propel in the lightweight wheelchair. Length of need Lifetime. Accessories: elevating leg rests (ELRs), wheel locks, extensions and anti-tippers.   09/29/19 1306          Allergies  Allergen Reactions  . Aricept [Donepezil Hcl] Other (See Comments)    nightmares  . Benzonatate Diarrhea  . Cefuroxime Diarrhea  .  Donepezil Other (See Comments)    NIGHTMARES  . Fish Oil Diarrhea and Other (See Comments)    Other reaction(s): EXCESSIVE-DIARRHEA  . Gabapentin Other (See Comments)    Other reaction(s): DROWSINESS  . Statins Other (See Comments)    Other reaction(s): MYALGIA  . Ezetimibe Other (See Comments)  . Loratadine Other (See Comments)  . Tramadol Other (See Comments)    Consultations:  None   Procedures/Studies:  No results found.    Subjective: Doing fine.  Wants to be discharged to the ALF.  Daughter at bedside updated.  Discharge Exam: Vitals:   10/01/19 0400 10/01/19 0741  BP: 129/85 (!) 125/92  Pulse: 70 78  Resp: 17 18  Temp: 98.6 F (37 C) 98.4 F (36.9 C)  SpO2: 100% 100%   Vitals:   09/30/19 1231 09/30/19 2009 10/01/19 0400 10/01/19 0741  BP: (!) 145/75 (!) 132/94 129/85 (!) 125/92  Pulse: 78 64 70 78  Resp: 18 17 17 18   Temp: 98.8 F (37.1 C) 98.2 F (36.8 C) 98.6 F (37 C) 98.4 F (36.9 C)  TempSrc: Oral Oral Oral Oral  SpO2: 100% 100% 100% 100%  Weight:      Height:        General: Pt is alert, awake, not in acute distress Cardiovascular: RRR, S1/S2 + Respiratory: CTA bilaterally, no wheezing, no rhonchi Abdominal: Soft, NT, ND, bowel sounds + Extremities: no edema, no cyanosis   The results of significant diagnostics from this hospitalization (including imaging, microbiology, ancillary and laboratory) are listed below for reference.     Microbiology: Recent Results (from the past 240 hour(s))  SARS CORONAVIRUS 2 (TAT 6-24 HRS) Nasopharyngeal Nasopharyngeal Swab     Status: None   Collection Time: 09/28/19  1:37 AM   Specimen: Nasopharyngeal Swab  Result Value Ref Range Status   SARS Coronavirus 2 NEGATIVE NEGATIVE Final    Comment: (NOTE) SARS-CoV-2 target nucleic acids are NOT DETECTED. The SARS-CoV-2 RNA is generally detectable in upper and lower respiratory specimens during the acute phase of infection. Negative results do not  preclude SARS-CoV-2 infection, do not rule out co-infections with other pathogens, and should not be used as the sole basis for treatment or other patient management decisions. Negative results must be combined with clinical observations, patient history, and epidemiological information. The expected result is Negative. Fact Sheet for Patients: SugarRoll.be Fact Sheet for Healthcare Providers: https://www.woods-mathews.com/ This test is not yet approved or cleared by the [place] FDA and  has been authorized for detection and/or diagnosis of SARS-CoV-2 by FDA under an Emergency Use Authorization (EUA). This EUA will remain  in effect (meaning this test can be used) for the duration of the COVID-19 declaration under Section 56 4(b)(1) of the Act, 21 U.S.C. section 360bbb-3(b)(1), unless the authorization is terminated or revoked sooner. Performed at [facility], [place] [address], [place], [place] [address]   Urine culture     Status: Abnormal   Collection Time: 09/28/19  1:41 AM   Specimen: Urine, Clean Catch  Result Value Ref Range Status   Specimen Description URINE, CLEAN CATCH  Final   Special Requests   Final    NONE Performed at [facility], [address], [place], [place] [address]    Culture MULTIPLE SPECIES PRESENT, SUGGEST RECOLLECTION (A)  Final   Report Status 09/28/2019 FINAL  Final     Labs: BNP (last 3 results) No results for input(s): BNP in the last 8760 hours. Basic Metabolic Panel: Recent Labs  Lab 09/27/19 2323 09/28/19 1159 09/29/19 0258 09/30/19 0133  NA 139 141 143 143  K 4.0 3.9 3.9 3.5  CL 108 115* 116* 119*  CO2 16* 16* 15* 16*  GLUCOSE 107* 115* 99 105*  BUN 74* 67* 58* 37*  CREATININE 3.81* 2.61* 1.67* 1.27*  CALCIUM 9.3 8.3* 8.4* 8.3*   Liver Function Tests: Recent Labs  Lab 09/27/19 2323  AST 14*  ALT 14  ALKPHOS 130*  BILITOT 0.9  PROT 7.2  ALBUMIN 3.9   No results  for input(s): LIPASE, AMYLASE in the last 168 hours. No results for input(s): AMMONIA in the last 168 hours. CBC: Recent Labs  Lab 09/27/19 2056 09/30/19 0133  WBC 7.7 6.7  NEUTROABS 4.1  --   HGB 14.1 11.0*  HCT 42.5 33.4*  MCV 89.7 88.8  PLT 80* 207   Cardiac Enzymes: No results for input(s): CKTOTAL, CKMB, CKMBINDEX, TROPONINI in the last 168 hours. BNP: Invalid input(s): POCBNP CBG: No results for input(s): GLUCAP in the last 168 hours. D-Dimer No results for input(s): DDIMER in the last 72 hours. Hgb A1c No results for input(s): HGBA1C in the last 72 hours. Lipid Profile No results for input(s): CHOL, HDL, LDLCALC, TRIG, CHOLHDL, LDLDIRECT in the last 72 hours. Thyroid function studies No results for input(s): TSH, T4TOTAL, T3FREE, THYROIDAB in the last 72 hours.  Invalid input(s): FREET3 Anemia work up Recent Labs    10/01/19 1054  VITAMINB12 262   Urinalysis    Component Value Date/Time   COLORURINE YELLOW 09/28/2019 0141   APPEARANCEUR CLOUDY (A) 09/28/2019 0141   LABSPEC >1.030 (H) 09/28/2019 0141   PHURINE 5.0 09/28/2019 0141   GLUCOSEU NEGATIVE 09/28/2019 0141   HGBUR MODERATE (A) 09/28/2019 0141   BILIRUBINUR NEGATIVE 09/28/2019 0141   KETONESUR NEGATIVE 09/28/2019 0141   PROTEINUR 30 (A) 09/28/2019 0141   UROBILINOGEN 0.2 03/25/2015 1325   NITRITE NEGATIVE 09/28/2019 0141   LEUKOCYTESUR MODERATE (A) 09/28/2019 0141   Sepsis Labs Invalid input(s): PROCALCITONIN,  WBC,  LACTICIDVEN Microbiology Recent Results (from the past 240 hour(s))  SARS CORONAVIRUS 2 (TAT 6-24 HRS) Nasopharyngeal Nasopharyngeal Swab     Status: None   Collection Time: 09/28/19  1:37 AM   Specimen: Nasopharyngeal Swab  Result Value Ref Range Status   SARS Coronavirus 2 NEGATIVE NEGATIVE Final    Comment: (NOTE) SARS-CoV-2 target nucleic acids are NOT DETECTED. The SARS-CoV-2 RNA is generally detectable in upper and lower respiratory specimens during the acute phase of  infection. Negative results do not preclude SARS-CoV-2 infection, do not rule out co-infections with other pathogens, and should not be used as the sole basis for treatment or other patient management decisions. Negative results must be combined with clinical observations, patient history, and epidemiological information. The expected result is Negative. Fact Sheet for Patients: SugarRoll.be Fact Sheet for Healthcare Providers: https://www.woods-mathews.com/ This test is not yet approved or cleared by the [place] FDA and  has been authorized for  detection and/or diagnosis of SARS-CoV-2 by FDA under an Emergency Use Authorization (EUA). This EUA will remain  in effect (meaning this test can be used) for the duration of the COVID-19 declaration under Section 56 4(b)(1) of the Act, 21 U.S.C. section 360bbb-3(b)(1), unless the authorization is terminated or revoked sooner. Performed at [facility], [place] [address] [place], [place] [address]   Urine culture     Status: Abnormal   Collection Time: 09/28/19  1:41 AM   Specimen: Urine, Clean Catch  Result Value Ref Range Status   Specimen Description URINE, CLEAN CATCH  Final   Special Requests   Final    NONE Performed at [facility], [place] [address] [place], [place] [address]    Culture MULTIPLE SPECIES PRESENT, SUGGEST RECOLLECTION (A)  Final   Report Status 09/28/2019 FINAL  Final     Time coordinating discharge: Over 30 minutes  SIGNED:   Lucky Cowboy, MD  [facility] 10/01/2019, 2:16 PM  If 7PM-7AM, please contact night-coverage

## 2019-10-01 NOTE — NC FL2 (Signed)
[place] LEVEL OF CARE SCREENING TOOL     IDENTIFICATION  Patient Name: Rebecca Travis Birthdate: 1924-12-08 Sex: female Admission Date (Current Location): 09/27/2019  [facility] and [place] Number:  Herbalist and Address:  [facility] [place] [address], [place], [place] [address]      Provider Number: [id number]  Attending Physician Name and Address:  Lucky Cowboy, MD  Relative Name and Phone Number:  Rudi Heap - daughter; [phone]    Current Level of Care: Hospital Recommended Level of Care: Assisted Living Facility([facility]) Prior Approval Number:    Date Approved/Denied:   PASRR Number:    Discharge Plan: Other (Comment)(ALF - [facility])    Current Diagnoses: Patient Active Problem List   Diagnosis Date Noted  . AKI (acute kidney injury) ([place]) 09/28/2019  . Risk for falls 11/28/2015  . 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 Dementia ([place]) 08/21/2015  . Tremor 08/21/2015  . Subdural hematoma ([place]) 03/25/2015  . Endometrial mass 09/15/2013    Orientation RESPIRATION BLADDER Height & Weight     Self  Normal Incontinent Weight: 158 lb (71.7 kg) Height:  5' 7.5" (171.5 cm)  BEHAVIORAL SYMPTOMS/MOOD NEUROLOGICAL BOWEL NUTRITION STATUS      Continent Diet(Regular)  AMBULATORY STATUS COMMUNICATION OF NEEDS Skin   Total Care(Patient was unable to ambulate with PT) Verbally Skin abrasions(Skin tear buttocks)                       Personal Care Assistance Level of Assistance  Bathing, Feeding, Dressing Bathing Assistance: Limited assistance Feeding assistance: Independent Dressing Assistance: Limited assistance     Functional Limitations Info  Sight, Hearing, Speech Sight Info: Adequate Hearing Info: Adequate Speech Info: Adequate    SPECIAL CARE FACTORS FREQUENCY  PT (By licensed PT)     PT Frequency: PT evaluated at hospital 2/17. PT at ALF,  eval and treat              Contractures Contractures Info: Not present    Additional Factors Info  Code Status, Allergies Code Status Info: DNR Allergies Info: Aricept donepezil hcl, Benzonatate, Cefuroxime, Donepezil, Fish oil, Gabapentin, Statins, Ezetimibe, Loratadine, and Tramadol           Current Medications (10/01/2019):  This is the current hospital active medication list Current Facility-Administered Medications  Medication Dose Route Frequency Provider Last Rate Last Admin  . acetaminophen (TYLENOL) tablet 650 mg  650 mg Oral Q6H PRN Neena Rhymes, MD   650 mg at 09/29/19 2037   Or  . acetaminophen (TYLENOL) suppository 650 mg  650 mg Rectal Q6H PRN Norins, Heinz Knuckles, MD      . amLODipine (NORVASC) tablet 2.5 mg  2.5 mg Oral Daily Eulogio Bear U, DO   2.5 mg at 10/01/19 0909  . aspirin tablet 325 mg  325 mg Oral Daily Norins, Heinz Knuckles, MD   325 mg at 10/01/19 0909  . enoxaparin (LOVENOX) injection 30 mg  30 mg Subcutaneous Daily Norins, Heinz Knuckles, MD   30 mg at 10/01/19 0909  . levothyroxine (SYNTHROID) tablet 75 mcg  75 mcg Oral Daily Neena Rhymes, MD   75 mcg at 10/01/19 0535  . rivastigmine (EXELON) 13.3 MG/24HR 13.3 mg  13.3 mg Transdermal Daily Norins, Heinz Knuckles, MD   13.3 mg at 10/01/19 0910  . senna (SENOKOT) tablet 8.6 mg  1 tablet Oral BID Norins, Heinz Knuckles,  MD   8.6 mg at 10/01/19 [id number]     Discharge Medications: Please see discharge summary for a list of discharge medications.  Relevant Imaging Results:  Relevant Lab Results:   Additional Information ss#[id number]. DISCHARGE MEDICATIONS:  Medication List        STOP taking these medications       lisinopril 20 MG tablet Commonly known as: ZESTRIL             TAKE these medications       amLODipine 2.5 MG tablet Commonly known as: NORVASC Take 2.5 mg by mouth daily.   aspirin 325 MG tablet Take 325 mg by mouth daily.   cetaphil lotion Apply 1 application topically  daily. To legs and feet   cyanocobalamin 1000 MCG tablet Take 1 tablet (1,000 mcg total) by mouth daily.   PROBIOTIC DAILY PO Take 1 capsule by mouth daily.   rivastigmine 13.3 MG/24HR Commonly known as: EXELON Place 13.3 mg onto the skin daily.   senna 8.6 MG Tabs tablet Commonly known as: SENOKOT Take 1 tablet (8.6 mg total) by mouth 2 (two) times daily.   Synthroid 50 MCG tablet Generic drug: levothyroxine Take 75 mcg by mouth daily.            Sable Feil, LCSW

## 2019-10-01 NOTE — TOC Transition Note (Addendum)
Transition of Care [facility]) - CM/SW Discharge Note *219/21 - Discharged back to [facility] via non-emergency ambulance   Patient Details  Name: Rebecca Travis MRN: [id number] Date of Birth: [birth year]  Transition of Care [facility]) CM/SW Contact:  Sable Feil, LCSW Phone Number: 10/01/2019, 4:01 PM   Clinical Narrative: Patient returning to [facility] today, transported by ambulance. Daughter aware of discharge. Clinicals faxed to facility, reviewed and ok'd by wellness nurse.    Final next level of care: Assisted Living Barriers to Discharge: No Barriers Identified   Patient Goals and CMS Choice Patient states their goals for this hospitalization and ongoing recovery are:: Daughter requests that patient return to ALF CMS Medicare.gov Compare Post Acute Care list provided to:: Other (Comment Required)(Not needed as patient returning to ALF) Choice offered to / list presented to : NA  Discharge Placement              Patient chooses bed at: (Patient returning to [facility]) Patient to be transferred to facility by: Ambulance Name of family member notified: Rudi Heap, daughter Patient and family notified of of transfer: 10/01/19  Discharge Plan and Services In-house Referral: Clinical Social Work              DME Arranged: ([employer] social research officer with seat cushion) DME Agency: [employer] Date DME Agency Contacted: 10/01/19 Time DME Agency Contacted: 1112 Representative spoke with at DME Agency: [facility] Arranged: Nurse's Aide Lamb Agency: (Per [facility])        Social Determinants of Health (SDOH) Interventions  None requested or needed prior to discharge.   Readmission Risk Interventions No flowsheet data found.

## 2019-10-01 NOTE — Care Management (Signed)
    Durable Medical Equipment  (From admission, onward)         Start     Ordered   09/29/19 1306  For home use only DME lightweight manual wheelchair with seat cushion  Once    Comments: Patient suffers from weakness which impairs their ability to perform daily activities like toileting in the home.  A cane will not resolve  issue with performing activities of daily living. A wheelchair will allow patient to safely perform daily activities. Patient is not able to propel themselves in the home using a standard weight wheelchair due to general weakness. Patient can self propel in the lightweight wheelchair. Length of need Lifetime. Accessories: elevating leg rests (ELRs), wheel locks, extensions and anti-tippers.   09/29/19 1306

## 2019-10-01 NOTE — Progress Notes (Signed)
Pt report given to receiving RN at [facility]; Corey Harold transport has been arranged by Education officer, museum.

## 2019-11-10 ENCOUNTER — Ambulatory Visit: Payer: Medicare Other | Admitting: Neurology

## 2019-12-14 ENCOUNTER — Telehealth: Payer: Self-pay | Admitting: Neurology

## 2019-12-14 NOTE — Telephone Encounter (Signed)
I called the pt's daughter Debroah Baller and asked for call back for sooner appt. I let her know the patient can see the NPs and there are openings this week (Megan & Amy) on Wednesday and Thursday. I left office number in message. When she calls back, please offer an appt with an NP. Alternatively if she does not wish to see one she can be r/s to June 2nd any opening.

## 2019-12-14 NOTE — Telephone Encounter (Signed)
Rebecca Travis,Rebecca Travis(daughter on DPR) has called, she agreed to r/s pt's f/u.  Pt is on wait list, daughter is asking if at all possible pt be able to get in as soon as possible since she has been waiting since March.  Rebecca Travis,Rebecca Travis stated the facility where pt lives is asking if pt's medication rivastigmine (EXELON) 13.3 MG/24HR can be changed to a pill because they do not remember to change out pt's patch.  Daughter is asking for a call to discuss before pt's appointment.

## 2019-12-15 NOTE — Telephone Encounter (Signed)
Pt daughter rene called to cancel apt states she is unable to bring pt and next avail at the time of call is in mid august. Advised we could schedule with np she declined.

## 2019-12-15 NOTE — Telephone Encounter (Signed)
I tried to reach [place] once more. I LVM and advised the pt can see the NP and we have an opening (as of now) as soon as tomorrow and I also see an opening next week. I asked for a call back asap so we can find something sooner for the patient. Left office number in message.   When the daughter calls back, please see if Amy or Jinny Blossom have anything soon. If not, please offer any open slot on 6/2 with Dr. Jaynee Eagles thanks.

## 2019-12-16 ENCOUNTER — Telehealth: Payer: Self-pay

## 2019-12-16 NOTE — Telephone Encounter (Signed)
noted 

## 2019-12-16 NOTE — Telephone Encounter (Signed)
An opening just became available for Wed 5/26 @ 8:30 AM. I spoke with [name] and she accepted the appt. Pt scheduled.

## 2019-12-16 NOTE — Telephone Encounter (Addendum)
Pt daughter renee called and  i advised that next avail with MD would be mid august. But we could possibly get her in sooner with NP she states she is unable to bring pt during  6/29-7/6 and 7/2-7/11 and would like to be seen sooner than august since they have been waiting 6 months for apt and does not wish to see NP.  Patient daughter requested message be forwarded to office manager that called her and requested a CB to discuss apt options

## 2019-12-16 NOTE — Telephone Encounter (Signed)
LVM for patients daughter to call back and schedule her moms appointment with Dr. Jaynee Eagles on June 2nd. This is an earlier appointment than we had before.

## 2019-12-16 NOTE — Telephone Encounter (Signed)
Spoke with patients daughter Joseph Art and explained that we were doing our very best to get patient in sooner. The available times did not work for her. I told her we would put her on cancellation list and call her as soon as we can. She was ok with this, in hopes of getting in sooner than August.

## 2019-12-16 NOTE — Telephone Encounter (Signed)
Pt on cancellation list. I will watch for a sooner appt. The June 2nd appt has been canceled since she has stated multiple times that she cannot bring pt that day.

## 2019-12-16 NOTE — Telephone Encounter (Signed)
Pt is on the wait list. Will be on the lookout for a sooner appointment.

## 2020-01-04 NOTE — Progress Notes (Signed)
[facility]    Provider:  Dr Jaynee Eagles Referring Provider: Lajean Manes, MD Primary Care Physician:  Lajean Manes, MD   Interval history Jan 05, 2020: Rebecca Travis is a 86 y.o. female initially here as a referral from Dr. Felipa Eth for memory problems in 2017. In August 2017 she underwent evacuation of a chronic subdural hematoma due to a fall.  Today she is back for a different reason, for tremors.  Patient was recently admitted to the hospital in February of this year for worsening mental status, labs showed elevated creatinine 3.81 on admission markedly improved with IV fluids and thought to be prerenal, UA showed leukocytes but urine culture showed multiple species not consistent with infection.  Worked with physical therapy who recommended home health PT in a wheelchair.  She has an allergy to Aricept nightmares but she does take rivastigmine, gabapentin causes her drowsiness.  She feels better, she is here and trying to hydrate. She is still at [facility]. Continues with short-term memory loss, she is very happy in her residence. She ws taken off memantine. She is doing well on the rivastigmine. She had a tremor more in the hospital, intermittent, more with action and reaching out. No falls, appetite is fine, no problems swallowing. The tremors don;t bother her. They are trying to encourage liquids. Patient doesn't notice the shaking. No family history. Discussed caffeine and using decaf. No resting tremor. No numbness or tingling, using a walker, vision is fine. She has had her thyroid checked recently. Daughter provides most information.   Interval history 03/10/2017: She is in assisted at [facility]. She is very happy, they take trips, they go to [facility] for dinner. She doesn't cook. They assist with medication, they have a med tech that comes around with medications. She is not getting exercise, decreased energy, she doesn't have the energy she used to  have but still very social. No falls. No problems swallowing, no choking. Memory is stable, just slowly progressive short-term memory.No Side effects to Rivastigmine and Namenda on full dose.   Interval history 07/30/2016: She is tired today, she was on a trip last night to [place]. Here with daughter. She lives in independent living and went with a group to [place]. Short-term memory worsening. She may be moving to assisted living. She needs assistance with medications. Time management is worsening. She may have to move.  She is struggling with her gait and transferring and getting in an out of care. She is getting weaker, she is not exercigin. She is at the [place] but will be going to [employer].When she gets to [place] patient's daughter will call for PT/OT.   Interval history 11/28/2015: Rebecca Travis is a 86 y.o. female here as a referral from Dr. Felipa Eth for memory problems. In August she underwent evacuation of a chronic subdural hematoma due to a fall. Since the surgery in August, she has been having memory problems. Mostly since the fall and hematoma. She is not having any side effects from the Exelon patch. Memory is stable. Discussed findings from carotid Dopplers and a 30 day cardiac monitor. Carotid Dopplers did not show any hemodynamically significant stenosis. The cardiac monitor did not show atrial fibrillation or atrial flutter. Echocardiogram showed mild left ventricular hypertrophy with grade 1 diastolic dysfunction Discussed the next steps of a workup including transesophageal echocardiogram and a loop recorder but patient and daughter declined. Did discuss that the patient has been treated were discovered atrial fibrillation she is at increased  risks for stroke and they understand this. Discussed staying on ASA 325 and this is the only intervention/treatment that they feel comfortable with. She is feeling weaker, more difficulty ambulating, fatigue, not  exercising, Sitting in a chair all day, near falls, stumbling, decreased balance. No swallowing difficulty. No depression or mood issues. Sleeping well and eating well. Feel as though patient could benefit from physical therapy, will ask [facility] for in home therapy.   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 Reviewed the images with patient and her daughter and pointed out the abnormalities and bilateral distribution. Recommended starting patient on asa 325 and further workup for etiology which would include TTE/TEE, holter monitor, bascular imaging for etiolgy such as paroxysmal afib which can place patient at high risk for further strokes. Discussed the newer anticoagulants such as Eliquis. They would like to think about whether they will proceed or not.  MRi of the brain: Small focus of restricted diffusion anterior-most insula on the RIGHT consistent with a acute or subacute infarct. Low level subcentimeter sized areas of apparent restriction throughout the BILATERAL RIGHT greater than LEFT subcortical white matter, suspected to represent areas of subacute infarction. Given the different locations, a shower of emboli is possible.  HPI: Rebecca Travis is a 86 y.o. female here as a referral from Dr. Felipa Eth for memory problems. In August she underwent evacuation of a chronic subdural hematoma due to a fall. Since the surgery in August, she has been having memory problems. Mostly since the fall and hematoma. Daughter is with patient and provides most information, daughter says she fell and was having headaches and 8 weeks afterwards she was diagnosed with a hematoma. She had surgery. She was put on memory medication, galantamine, but she had increased fatigue and the medication was stopped. She has also started having tremors. Started in  November. Stopping the galantamine did not help the tremors. Memory changes are progressive and worsening rapidly. She had baseline memory problems before the fall. She would get confused but she was living independently before the fall, she was active and independent, she was [employer]. Memory problems first started at least a few months before the fall, she would forget appointment and times. She would have to write everything down. Tremor in the hands and tremor in the mouth. She never had tremors before the bleed oruntil after the surgery. She had the surgery and did better but has been declining ever since then. She had nightmares on the Aricept. She has depression after all of this. There have been a lot of changes. She is in independent living now. Patient says she feels very sad. She is not happy. The lack of independence is worrisome. She is in a new environment in independent living. She has fatigue, not exercising like she used, she has PT twice a week. She has confusion sometimes, the phone was ringing and she picked up the remote control. Forgetting to turn the alarm system off or on. More short-term meory issues, she gets distracted, difficulty focusing.   Reviewed notes, labs and imaging from outside physicians, which showed:  [facility]: Patient was seen by Dr Kathyrn Sheriff Who performed the evacuation of the chronic subdural hematoma. He states that after that she had improvement in headaches but continues to experience generalized fatigue as well as bilateral tremor which is worse  in the morning and continued difficulty with memory. She was taken to the emergency department for these complaints at the end of November when repeat CT scan was done which was essentially normal without evidence of any further left-sided subdural hematoma. There were subsequently seen by their geriatrician Actigraph galantamine. Fatigue has improved but the rest of  the symptoms are unchanged and she was sent here for evaluation.   CT of the head 06/2015: personally reviewed images and agree with findings:: Findings consistent with status post left craniotomy. Calvarium intact otherwise. Diffuse atrophy with white matter low attenuation. No evidence of acute vascular territory infarct or mass. No hemorrhage or extra-axial fluid.  IMPRESSION: Severe chronic involutional change with no acute findings in this patient with prior subdural hematoma on the left. No evidence of extra-axial fluid collection today.  Review of Systems: Patient complains of symptoms per HPI as well as the following symptoms: memory loss, tremor. Pertinent negatives per HPI. All others negative.  Social History   Socioeconomic History  . Marital status: Divorced    Spouse name: Not on file  . Number of children: 3  . Years of education: Not on file  . Highest education level: Not on file  Occupational History  . Occupation: Retired  Tobacco Use  . Smoking status: Former Smoker    Packs/day: 0.15    Types: Cigarettes    Quit date: 08/13/1951    Years since quitting: 68.4  . Smokeless tobacco: Never Used  Substance and Sexual Activity  . Alcohol use: Yes    Alcohol/week: 0.0 standard drinks    Comment: 1 glass wine per week  . Drug use: No  . Sexual activity: Not Currently  Other Topics Concern  . Not on file  Social History Narrative   Lives In assisted living at [facility]    Caffeine use: 1 cup coffee in the  Morning    Right handed   Social Determinants of Health   Financial Resource Strain:   . Difficulty of Paying Living Expenses:   Food Insecurity:   . Worried About Charity fundraiser in the Last Year:   . Arboriculturist in the Last Year:   Transportation Needs:   . Film/video editor (Medical):   Marland Kitchen Lack of Transportation (Non-Medical):   Physical Activity:   . Days of Exercise per Week:   . Minutes of Exercise per Session:   Stress:     . Feeling of Stress :   Social Connections:   . Frequency of Communication with Friends and Family:   . Frequency of Social Gatherings with Friends and Family:   . Attends Religious Services:   . Active Member of Clubs or Organizations:   . Attends Archivist Meetings:   Marland Kitchen Marital Status:   Intimate Partner Violence:   . Fear of Current or Ex-Partner:   . Emotionally Abused:   Marland Kitchen Physically Abused:   . Sexually Abused:     Family History  Problem Relation Age of Onset  . Stroke Father   . Dementia Neg Hx   . Tremor Neg Hx     Past Medical History:  Diagnosis Date  . Arthritis    shoulder, knee  - otc med prn  . Cancer ([place])   . GERD (gastroesophageal reflux disease)   . Hyperlipidemia    diet controlled - no meds  . Hypertension   . Hypothyroidism   . Subdural hematoma ([place])   . SVD (spontaneous  vaginal delivery)    x 3    Past Surgical History:  Procedure Laterality Date  . BREAST SURGERY     right breast duct   . COLONOSCOPY    . CRANIOTOMY Left 03/25/2015   Procedure: CRANIOTOMY HEMATOMA EVACUATION SUBDURAL;  Surgeon: Consuella Lose, MD;  Location: [facility];  Service: Neurosurgery;  Laterality: Left;  . HYSTEROSCOPY WITH D & C N/A 09/15/2013   Procedure: DILATATION AND CURETTAGE /HYSTEROSCOPY WITH UTERINE POLYP RESECTION;  Surgeon: Eldred Manges, MD;  Location: [facility];  Service: Gynecology;  Laterality: N/A;  . TOOTH EXTRACTION    . TOTAL THYROIDECTOMY      Current Outpatient Medications  Medication Sig Dispense Refill  . aspirin 325 MG tablet Take 325 mg by mouth daily.    . Probiotic Product (PROBIOTIC DAILY PO) Take 1 capsule by mouth daily.    . rivastigmine (EXELON) 13.3 MG/24HR Place 13.3 mg onto the skin daily.     Marland Kitchen SYNTHROID 50 MCG tablet Take 75 mcg by mouth daily.     . vitamin B-12 1000 MCG tablet Take 1 tablet (1,000 mcg total) by mouth daily. 30 tablet 1   No current facility-administered medications for this visit.     Allergies as of 01/05/2020 - Review Complete 01/05/2020  Allergen Reaction Noted  . Aricept [donepezil hcl] Other (See Comments) 03/25/2015  . Benzonatate Diarrhea 03/25/2015  . Cefuroxime Diarrhea 03/25/2015  . Donepezil Other (See Comments)   . Fish oil Diarrhea and Other (See Comments) 03/25/2015  . Gabapentin Other (See Comments) 03/25/2015  . Statins Other (See Comments) 03/25/2015  . Ezetimibe Other (See Comments) 07/09/2015  . Loratadine Other (See Comments) 03/25/2015  . Tramadol Other (See Comments) 07/09/2015    Vitals: BP (!) 141/87 (BP Location: Right Arm, Patient Position: Sitting)   Pulse 64   Ht 5\' 4"  (1.626 m)   Wt 163 lb (73.9 kg)   BMI 27.98 kg/m  Last Weight:  Wt Readings from Last 1 Encounters:  01/05/20 163 lb (73.9 kg)   Last Height:   Ht Readings from Last 1 Encounters:  01/05/20 5\' 4"  (1.626 m)   MMSE - Mini Mental State Exam 03/10/2017  Orientation to time 1  Orientation to Place 4  Registration 3  Attention/ Calculation 5  Recall 1  Language- name 2 objects 2  Language- repeat 1  Language- follow 3 step command 3  Language- read & follow direction 1  Write a sentence 1  Copy design 1  Total score 23   Physical exam: Exam: Gen: NAD, conversant, well nourised, well groomed                     Eyes: Conjunctivae clear without exudates or hemorrhage  Neuro: Detailed Neurologic Exam  Speech:    Speech is normal; fluent and spontaneous with normal comprehension.  Cognition:  impaired short term memory  Cranial Nerves:    The pupils are equal, round, and reactive to light.. Visual fields are full to finger confrontation. Mild dysconjugte gaze(chronic) but Extraocular movements are intact. Trigeminal sensation is intact and the muscles of mastication are normal. The face is symmetric. The palate elevates in the midline. Hearing intact. Voice is normal. Shoulder shrug is normal. The tongue has normal motion without fasciculations.    Coordination:    No ataxia or dysmetria  Gait:    Using a walker, good stride, not shuffling  Motor Observation:    Mild postural and action tremor. Tone:  Normal muscle tone.    Posture:    Posture is normal. normal erect    Strength:    Strength is V/V in the upper and lower limbs.      Sensation: intact to LT     Reflex Exam:  DTR's:    Absent AJs, otherwise deep tendon reflexes in the upper and lower extremities are symmetrical bilaterally.  Slightly brisk.   Clonus:    Clonus is absent.   Assessment/Plan: This is a lovely 86 year old female who had some baseline memory deficits and then fell causing a chronic subdural hematoma with subsequent evacuation. Since then patient's cognition has been further declining and continues to decline, likely alzheimer's dementia. Feel that this is multifactorial, it appears as though patient did have some baseline memory deficits which were worsened by the chronic subdural hematoma  I do following with primary care for depression and fatigue. Encouraged again patient to be social with in her new living situation and be involved with some of the activities.    Dementia: slowly progressive. Doing well. Is in assisted living now. Will continue Exelon.Stopped Namenda and at this time I don;t think it would make much of a clinical difference.   Tremor: Likely benign, essential, discussed caffeine and other causes, not affecting patient I recommend monitoring and if it worsens we can try weighted silverware or wrist weights.   MRI of the brain 2017 which showed multiple areas of bilateral restricted diffusion suspected to be subacute infarction due to emboli.   - Recommended continuing patient on asa and further workup for etiology which would include TTE/TEE, holter monitor, vascular imaging for etiolgy such as paroxysmal afib which can place patient at high risk for further strokes. Discussed the newer anticoagulants such as Eliquis.  Patient is on aspirin 325. Echocardiogram showed mild left ventricular hypertrophy with grade 1 diastolic dysfunction ( asked them to follow-up with primary care regarding this). The cardiac monitor did not show atrial fibrillation or atrial flutter. Discussed the next steps of a workup including transesophageal echocardiogram and a loop recorder but patient and daughter declined again. Discussed staying on ASA 325 and this is the only intervention/treatment that they feel comfortable with and do not wish to further pursue evaluation for atrial fibrillation despite increased risk of stroke and are not interested in medication such as request.  -Patient needs assisted living. Today's history and physical demonstrated measurable cognitive losses consistent with dementia. Based on the prior experiences in the community and the degree of impairment is clear that she does not have the capacity to make an informed and appropriate decisions on her healthcare and finances. I do recommend that she lives in a assisted living setting as soon as possible for her safety. She can perform all her own ADLs and she is lovely with no behavioral issues, she will do excellent in assisted living.  She is at risk for falls and self-neglect in independent living.  Sarina Ill, MD  [facility] [address] [place], [place] [address]  Phone [phone] Fax [phone]  I spent 25 minutes of face-to-face and non-face-to-face time with patient on the  1. Dementia without behavioral disturbance, unspecified dementia type ([place])   2. Benign essential tremor    diagnosis.  This included previsit chart review, lab review, study review, order entry, electronic health record documentation, patient education on the different diagnostic and therapeutic options, counseling and coordination of care, risks and benefits of management, compliance, or risk factor reduction

## 2020-01-05 ENCOUNTER — Other Ambulatory Visit: Payer: Self-pay

## 2020-01-05 ENCOUNTER — Ambulatory Visit: Payer: Medicare Other | Admitting: Neurology

## 2020-01-05 ENCOUNTER — Encounter: Payer: Self-pay | Admitting: Neurology

## 2020-01-05 VITALS — BP 141/87 | HR 64 | Ht 64.0 in | Wt 163.0 lb

## 2020-01-05 DIAGNOSIS — G250 Essential tremor: Secondary | ICD-10-CM

## 2020-01-05 DIAGNOSIS — F0390 Unspecified dementia without behavioral disturbance: Secondary | ICD-10-CM

## 2020-01-05 NOTE — Patient Instructions (Signed)

## 2020-01-06 ENCOUNTER — Ambulatory Visit: Payer: Medicare Other | Admitting: Neurology

## 2020-01-12 ENCOUNTER — Ambulatory Visit: Payer: Medicare Other | Admitting: Neurology

## 2020-03-29 ENCOUNTER — Ambulatory Visit: Payer: Medicare Other | Admitting: Neurology

## 2021-10-23 ENCOUNTER — Other Ambulatory Visit: Payer: Self-pay | Admitting: Internal Medicine

## 2021-10-23 ENCOUNTER — Ambulatory Visit
Admission: RE | Admit: 2021-10-23 | Discharge: 2021-10-23 | Disposition: A | Discharge status: Home / Self Care | Payer: Medicare Other | Source: Ambulatory Visit | Attending: Internal Medicine | Admitting: Internal Medicine

## 2021-10-23 DIAGNOSIS — J189 Pneumonia, unspecified organism: Secondary | ICD-10-CM

## 2021-10-26 ENCOUNTER — Other Ambulatory Visit: Payer: Self-pay | Admitting: Geriatric Medicine

## 2021-10-26 DIAGNOSIS — R911 Solitary pulmonary nodule: Secondary | ICD-10-CM

## 2021-11-14 ENCOUNTER — Ambulatory Visit
Admission: RE | Admit: 2021-11-14 | Discharge: 2021-11-14 | Disposition: A | Discharge status: Home / Self Care | Payer: Medicare Other | Source: Ambulatory Visit | Attending: Geriatric Medicine | Admitting: Geriatric Medicine

## 2021-11-14 DIAGNOSIS — R911 Solitary pulmonary nodule: Secondary | ICD-10-CM

## 2022-02-13 ENCOUNTER — Inpatient Hospital Stay (HOSPITAL_COMMUNITY)
Admission: EM | Admit: 2022-02-13 | Discharge: 2022-02-15 | DRG: 202 | Disposition: A | Discharge status: Home / Self Care | Payer: Medicare Other | Source: Skilled Nursing Facility | Attending: Family Medicine | Admitting: Family Medicine

## 2022-02-13 ENCOUNTER — Encounter (HOSPITAL_COMMUNITY): Payer: Self-pay | Admitting: Emergency Medicine

## 2022-02-13 ENCOUNTER — Emergency Department (HOSPITAL_COMMUNITY): Payer: Medicare Other

## 2022-02-13 ENCOUNTER — Other Ambulatory Visit: Payer: Self-pay

## 2022-02-13 DIAGNOSIS — J9811 Atelectasis: Secondary | ICD-10-CM | POA: Diagnosis present

## 2022-02-13 DIAGNOSIS — F0390 Unspecified dementia without behavioral disturbance: Secondary | ICD-10-CM | POA: Diagnosis present

## 2022-02-13 DIAGNOSIS — J209 Acute bronchitis, unspecified: Principal | ICD-10-CM | POA: Diagnosis present

## 2022-02-13 DIAGNOSIS — I10 Essential (primary) hypertension: Secondary | ICD-10-CM | POA: Diagnosis present

## 2022-02-13 DIAGNOSIS — M1990 Unspecified osteoarthritis, unspecified site: Secondary | ICD-10-CM | POA: Diagnosis present

## 2022-02-13 DIAGNOSIS — R0602 Shortness of breath: Secondary | ICD-10-CM | POA: Diagnosis present

## 2022-02-13 DIAGNOSIS — Z885 Allergy status to narcotic agent status: Secondary | ICD-10-CM

## 2022-02-13 DIAGNOSIS — Z20822 Contact with and (suspected) exposure to covid-19: Secondary | ICD-10-CM | POA: Diagnosis present

## 2022-02-13 DIAGNOSIS — E785 Hyperlipidemia, unspecified: Secondary | ICD-10-CM | POA: Diagnosis present

## 2022-02-13 DIAGNOSIS — Z66 Do not resuscitate: Secondary | ICD-10-CM | POA: Diagnosis present

## 2022-02-13 DIAGNOSIS — Z79890 Hormone replacement therapy (postmenopausal): Secondary | ICD-10-CM

## 2022-02-13 DIAGNOSIS — Z7982 Long term (current) use of aspirin: Secondary | ICD-10-CM

## 2022-02-13 DIAGNOSIS — E890 Postprocedural hypothyroidism: Secondary | ICD-10-CM | POA: Diagnosis present

## 2022-02-13 DIAGNOSIS — K219 Gastro-esophageal reflux disease without esophagitis: Secondary | ICD-10-CM | POA: Diagnosis present

## 2022-02-13 DIAGNOSIS — Z888 Allergy status to other drugs, medicaments and biological substances status: Secondary | ICD-10-CM

## 2022-02-13 DIAGNOSIS — Z79899 Other long term (current) drug therapy: Secondary | ICD-10-CM

## 2022-02-13 DIAGNOSIS — Z87891 Personal history of nicotine dependence: Secondary | ICD-10-CM

## 2022-02-13 DIAGNOSIS — R0603 Acute respiratory distress: Secondary | ICD-10-CM | POA: Diagnosis present

## 2022-02-13 DIAGNOSIS — E039 Hypothyroidism, unspecified: Secondary | ICD-10-CM | POA: Diagnosis present

## 2022-02-13 DIAGNOSIS — Z881 Allergy status to other antibiotic agents status: Secondary | ICD-10-CM

## 2022-02-13 LAB — CBC WITH DIFFERENTIAL/PLATELET
Abs Immature Granulocytes: 0.04 10*3/uL (ref 0.00–0.07)
Basophils Absolute: 0.1 10*3/uL (ref 0.0–0.1)
Basophils Relative: 0 %
Eosinophils Absolute: 0 10*3/uL (ref 0.0–0.5)
Eosinophils Relative: 0 %
HCT: 40.5 % (ref 36.0–46.0)
Hemoglobin: 13 g/dL (ref 12.0–15.0)
Immature Granulocytes: 0 %
Lymphocytes Relative: 33 %
Lymphs Abs: 4.8 10*3/uL — ABNORMAL HIGH (ref 0.7–4.0)
MCH: 29.1 pg (ref 26.0–34.0)
MCHC: 32.1 g/dL (ref 30.0–36.0)
MCV: 90.6 fL (ref 80.0–100.0)
Monocytes Absolute: 1.4 10*3/uL — ABNORMAL HIGH (ref 0.1–1.0)
Monocytes Relative: 9 %
Neutro Abs: 8.3 10*3/uL — ABNORMAL HIGH (ref 1.7–7.7)
Neutrophils Relative %: 58 %
Platelets: 267 10*3/uL (ref 150–400)
RBC: 4.47 MIL/uL (ref 3.87–5.11)
RDW: 14.3 % (ref 11.5–15.5)
WBC: 14.6 10*3/uL — ABNORMAL HIGH (ref 4.0–10.5)
nRBC: 0 % (ref 0.0–0.2)

## 2022-02-13 LAB — BASIC METABOLIC PANEL
Anion gap: 13 (ref 5–15)
BUN: 18 mg/dL (ref 8–23)
CO2: 24 mmol/L (ref 22–32)
Calcium: 9 mg/dL (ref 8.9–10.3)
Chloride: 103 mmol/L (ref 98–111)
Creatinine, Ser: 0.98 mg/dL (ref 0.44–1.00)
GFR, Estimated: 53 mL/min — ABNORMAL LOW (ref >60)
Glucose, Bld: 154 mg/dL — ABNORMAL HIGH (ref 70–99)
Potassium: 3.3 mmol/L — ABNORMAL LOW (ref 3.5–5.1)
Sodium: 140 mmol/L (ref 135–145)

## 2022-02-13 LAB — RESP PANEL BY RT-PCR (FLU A&B, COVID) ARPGX2
Influenza A by PCR: NEGATIVE
Influenza B by PCR: NEGATIVE
SARS Coronavirus 2 by RT PCR: NEGATIVE

## 2022-02-13 LAB — BRAIN NATRIURETIC PEPTIDE: B Natriuretic Peptide: 68.5 pg/mL (ref 0.0–100.0)

## 2022-02-13 LAB — TROPONIN I (HIGH SENSITIVITY)
Troponin I (High Sensitivity): 7 ng/L (ref <18)
Troponin I (High Sensitivity): 8 ng/L (ref <18)

## 2022-02-13 MED ORDER — MAGNESIUM SULFATE 2 GM/50ML IV SOLN
2.0000 g | Freq: Once | INTRAVENOUS | Status: AC
  Administered 2022-02-13: 2 g via INTRAVENOUS
  Filled 2022-02-13: qty 50

## 2022-02-13 MED ORDER — IPRATROPIUM-ALBUTEROL 0.5-2.5 (3) MG/3ML IN SOLN
3.0000 mL | Freq: Once | RESPIRATORY_TRACT | Status: AC
  Administered 2022-02-13: 3 mL via RESPIRATORY_TRACT
  Filled 2022-02-13: qty 3

## 2022-02-13 MED ORDER — METHYLPREDNISOLONE SODIUM SUCC 125 MG IJ SOLR
125.0000 mg | Freq: Once | INTRAMUSCULAR | Status: AC
  Administered 2022-02-13: 125 mg via INTRAVENOUS
  Filled 2022-02-13: qty 2

## 2022-02-13 MED ORDER — IOHEXOL 350 MG/ML SOLN
70.0000 mL | Freq: Once | INTRAVENOUS | Status: AC | PRN
  Administered 2022-02-13: 70 mL via INTRAVENOUS

## 2022-02-13 NOTE — ED Notes (Signed)
Patient transported to CT 

## 2022-02-13 NOTE — H&P (Signed)
History and Physical    Rebecca Travis ZOX:[id number] DOB: December 07, 1924 DOA: 02/13/2022  PCP: Merlene Laughter, MD  Patient coming from: [facility]  I have personally briefly reviewed patient's old medical records in [facility]  Chief Complaint: Cough, shortness of breath  HPI: Rebecca Travis is a 88 y.o. female with medical history significant for HTN, HLD, hypothyroidism, SDH s/p craniotomy/evacuation 03/2015, dementia who presented to the ED from [facility] for evaluation of dyspnea.  History is limited from patient due to dementia and is otherwise supplemented by EDP, chart review, and family at bedside.  Family notes that patient has had several days of dyspnea, audible wheezing, and nonproductive cough.  She has no known history of asthma or COPD.  She denies any chest pain, abdominal pain, dysuria.  She has chronic swelling to both lower extremities which is unchanged from baseline.  ED Course  Labs/Imaging on admission: I have personally reviewed following labs and imaging studies.  Initial vitals showed BP 113/62, pulse 103, RR 20, temp 98.7 F, SPO2 97% on room air.  Labs show WBC 14.6, hemoglobin 13.0, platelets 267,000, sodium 140, potassium 3.3, bicarb 24, BUN 18, creatinine 0.98, serum glucose 154, BNP 68.5, troponin 7 > 8.  SARS-CoV-2 and influenza PCR negative.  Portable chest x-ray negative for focal consolidation, edema, effusion.  Chronic right upper lobe calcified granuloma noted.  CTA chest PE study negative for evidence of PE.  Severe bronchial thickening with areas of mucoid impaction and subsegmental atelectasis noted.  Patient was given IV Solu-Medrol 125 mg, IV magnesium 2 g, DuoNeb.  The hospitalist service was consulted to admit for further evaluation and management.  Review of Systems: All systems reviewed and are negative except as documented in history of present illness above.   Past Medical History:  Diagnosis  Date   Arthritis    shoulder, knee  - otc med prn   Cancer (HCC)    GERD (gastroesophageal reflux disease)    Hyperlipidemia    diet controlled - no meds   Hypertension    Hypothyroidism    Subdural hematoma (HCC)    SVD (spontaneous vaginal delivery)    x 3    Past Surgical History:  Procedure Laterality Date   BREAST SURGERY     right breast duct    COLONOSCOPY     CRANIOTOMY Left 03/25/2015   Procedure: CRANIOTOMY HEMATOMA EVACUATION SUBDURAL;  Surgeon: Lisbeth Renshaw, MD;  Location: [facility];  Service: Neurosurgery;  Laterality: Left;   HYSTEROSCOPY WITH D & C N/A 09/15/2013   Procedure: DILATATION AND CURETTAGE /HYSTEROSCOPY WITH UTERINE POLYP RESECTION;  Surgeon: Hal Morales, MD;  Location: [facility];  Service: Gynecology;  Laterality: N/A;   TOOTH EXTRACTION     TOTAL THYROIDECTOMY      Social History:  reports that she quit smoking about 70 years ago. Her smoking use included cigarettes. She smoked an average of .15 packs per day. She has never used smokeless tobacco. She reports current alcohol use. She reports that she does not use drugs.  Allergies  Allergen Reactions   Aricept [Donepezil Hcl] Other (See Comments)    nightmares   Benzonatate Diarrhea   Cefuroxime Diarrhea   Donepezil Other (See Comments)    NIGHTMARES   Fish Oil Diarrhea and Other (See Comments)    EXCESSIVE DIARRHEA   Gabapentin Other (See Comments)    DROWSINESS   Statins Other (See Comments)    MYALGIAS   Ezetimibe  Other (See Comments)   Loratadine Other (See Comments)   Tramadol Other (See Comments)    Family History  Problem Relation Age of Onset   Stroke Father    Dementia Neg Hx    Tremor Neg Hx      Prior to Admission medications   Medication Sig Start Date End Date Taking? Authorizing Provider  amLODipine (NORVASC) 5 MG tablet Take 5 mg by mouth daily.   Yes [provider]  aspirin 325 MG tablet Take 325 mg by mouth daily.   Yes [provider]   FLORASTOR 250 MG capsule Take 250 mg by mouth daily.   Yes [provider]  rivastigmine (EXELON) 13.3 MG/24HR Place 13.3 mg onto the skin daily.  02/07/17  Yes [provider]  SYNTHROID 50 MCG tablet Take 50 mcg by mouth daily before breakfast. 01/30/15  Yes [provider]  vitamin B-12 1000 MCG tablet Take 1 tablet (1,000 mcg total) by mouth daily. 10/01/19  Yes Liborio Nixon, MD  MUCINEX 600 MG 12 hr tablet Take 600 mg by mouth 2 (two) times daily. Patient not taking: Reported on 02/13/2022 02/14/22 02/20/22  [provider]    Physical Exam: Vitals:   02/13/22 2045 02/13/22 2200 02/13/22 2215 02/13/22 2300  BP: 93/62 112/69 115/71 116/88  Pulse: 89 96 88 90  Resp: 15 17 16  (!) 23  Temp:      TempSrc:      SpO2: 97% 99% 98% 98%   Constitutional: Resting in bed, NAD, calm, comfortable Eyes: EOMI, lids and conjunctivae normal ENMT: Mucous membranes are moist. Posterior pharynx clear of any exudate or lesions.Normal dentition.  Neck: normal, supple, no masses. Respiratory: Coarse breath sounds with upper airway expiratory wheezing. Normal respiratory effort. No accessory muscle use.  Cardiovascular: Regular rate and rhythm, no murmurs / rubs / gallops.  Nonpitting edema both lower extremities. 2+ pedal pulses. Abdomen: no tenderness, no masses palpated.  Musculoskeletal: no clubbing / cyanosis. No joint deformity upper and lower extremities. Good ROM, no contractures. Normal muscle tone.  Skin: no rashes, lesions, ulcers. No induration Neurologic: Sensation intact. Strength 5/5 in all 4.  Psychiatric: Alert and oriented x 3. Normal mood.   EKG: Personally reviewed. Sinus rhythm, rate 101, first-degree AV block, QTc 610.  QTc more prolonged when compared to prior.  Assessment/Plan Principal Problem:   Shortness of breath Active Problems:   Hypertension   Hypothyroidism   Dementia (HCC)   Rebecca Travis is a 88 y.o. female with medical  history significant for HTN, HLD, hypothyroidism, SDH s/p craniotomy/evacuation 03/2015, dementia who is admitted with shortness of breath.  Assessment and Plan: * Shortness of breath CTA negative for PE but shows severe bronchial thickening with mucoid impaction and lower lobe atelectasis.  No focal consolidation, pulmonary nodule, or mass.  SARS-CoV-2 and influenza PCRs are negative.  Leukocytosis of 14.6 present on admission.  Still with continued dyspnea and upper airway wheezing but saturating well on room air. -Start on hypertonic saline nebulizers BID and DuoNeb as needed -Start oral doxycycline 100 mg twice daily -Start prednisone 40 mg daily -Mucinex 600 mg twice daily -Supplemental oxygen as needed -Incentive spirometer and flutter valve  Hypertension Continue home amlodipine.  Hypothyroidism Continue home Synthroid.  Dementia (HCC) Continue home Exelon, delirium precautions.  DVT prophylaxis: enoxaparin (LOVENOX) injection 40 mg Start: 02/14/22 0045 Code Status: DNR, confirmed with patient's daughter on admission Family Communication: Discussed with daughter and son-in-law at bedside Disposition Plan: From  ALF and likely discharge to same facility pending clinical progress Consults called: None Severity of Illness: The appropriate patient status for this patient is OBSERVATION. Observation status is judged to be reasonable and necessary in order to provide the required intensity of service to ensure the patient's safety. The patient's presenting symptoms, physical exam findings, and initial radiographic and laboratory data in the context of their medical condition is felt to place them at decreased risk for further clinical deterioration. Furthermore, it is anticipated that the patient will be medically stable for discharge from the hospital within 2 midnights of admission.   Darreld Mclean MD [facility]  If 7PM-7AM, please contact  night-coverage [email or web address]  02/14/2022, 12:35 AM

## 2022-02-13 NOTE — ED Provider Notes (Signed)
[facility] EMERGENCY DEPARTMENT Provider Note   CSN: [id number] Arrival date & time: 02/13/22  1659     History  Chief Complaint  Patient presents with   Shortness of Breath    Rebecca Travis is a 88 y.o. female.   Shortness of Breath   Patient with medical history of hypertension, hyperlipidemia, hypothyroid presents today due to shortness of breath/cough x2 days.  She lives in a facility, reports other residents of been sick over the last few days.  Patient denies any fevers or chills but has not having a nonproductive cough for the last 48 hours.  Brought by daughter to urgent care and EMS called to bring to ED for further evaluation.  Patient feels short of breath and is wheezing, denies any history of cigarette use, COPD or asthma.  Not having pain anywhere  Home Medications Prior to Admission medications   Medication Sig Start Date End Date Taking? Authorizing Provider  amLODipine (NORVASC) 5 MG tablet Take 5 mg by mouth daily.   Yes [provider]  aspirin 325 MG tablet Take 325 mg by mouth daily.   Yes [provider]  FLORASTOR 250 MG capsule Take 250 mg by mouth daily.   Yes [provider]  rivastigmine (EXELON) 13.3 MG/24HR Place 13.3 mg onto the skin daily.  02/07/17  Yes [provider]  SYNTHROID 50 MCG tablet Take 50 mcg by mouth daily before breakfast. 01/30/15  Yes [provider]  vitamin B-12 1000 MCG tablet Take 1 tablet (1,000 mcg total) by mouth daily. 10/01/19  Yes Lucky Cowboy, MD  MUCINEX 600 MG 12 hr tablet Take 600 mg by mouth 2 (two) times daily. Patient not taking: Reported on 02/13/2022 02/14/22 02/20/22  [provider]      Allergies    Aricept Reather Littler hcl], Benzonatate, Cefuroxime, Donepezil, Fish oil, Gabapentin, Statins, Ezetimibe, Loratadine, and Tramadol    Review of Systems   Review of Systems  Respiratory:  Positive for shortness of breath.     Physical  Exam Updated Vital Signs BP 115/71   Pulse 88   Temp 98.7 F (37.1 C) (Oral)   Resp 16   SpO2 98%  Physical Exam Vitals and nursing note reviewed. Exam conducted with a chaperone present.  Constitutional:      Appearance: Normal appearance.  HENT:     Head: Normocephalic and atraumatic.  Eyes:     General: No scleral icterus.       Right eye: No discharge.        Left eye: No discharge.     Extraocular Movements: Extraocular movements intact.     Pupils: Pupils are equal, round, and reactive to light.  Neck:     Vascular: No JVD.  Cardiovascular:     Rate and Rhythm: Normal rate and regular rhythm.     Pulses: Normal pulses.     Heart sounds: Normal heart sounds. No murmur heard.    No friction rub. No gallop.  Pulmonary:     Effort: Pulmonary effort is normal. No respiratory distress.     Breath sounds: Decreased breath sounds and wheezing present.  Abdominal:     General: Abdomen is flat. Bowel sounds are normal. There is no distension.     Palpations: Abdomen is soft.     Tenderness: There is no abdominal tenderness.  Musculoskeletal:     Right lower leg: Edema present.     Left lower leg: Edema present.  Comments: Pitting edema bilaterally to lower extremities  Skin:    General: Skin is warm and dry.     Coloration: Skin is not jaundiced.  Neurological:     Mental Status: She is alert. Mental status is at baseline.     Coordination: Coordination normal.     ED Results / Procedures / Treatments   Labs (all labs ordered are listed, but only abnormal results are displayed) Labs Reviewed  BASIC METABOLIC PANEL - Abnormal; Notable for the following components:      Result Value   Potassium 3.3 (*)    Glucose, Bld 154 (*)    GFR, Estimated 53 (*)    All other components within normal limits  CBC WITH DIFFERENTIAL/PLATELET - Abnormal; Notable for the following components:   WBC 14.6 (*)    Neutro Abs 8.3 (*)    Lymphs Abs 4.8 (*)    Monocytes Absolute 1.4  (*)    All other components within normal limits  RESP PANEL BY RT-PCR (FLU A&B, COVID) [id number]  BRAIN NATRIURETIC PEPTIDE  TROPONIN I (HIGH SENSITIVITY)  TROPONIN I (HIGH SENSITIVITY)    EKG EKG Interpretation  Date/Time:  Wednesday February 13 2022 17:13:21 EDT Ventricular Rate:  101 PR Interval:  52 QRS Duration: 137 QT Interval:  470 QTC Calculation: 610 R Axis:   26 Text Interpretation: Sinus with PR prolongation Probable left ventricular hypertrophy Prolonged QT interval Otherwise no significant change from prior 9/17 Confirmed by Aletta Edouard [phone]) on 02/13/2022 5:17:20 PM  Radiology CT Angio Chest PE W/Cm &/Or Wo Cm  Result Date: 02/13/2022 CLINICAL DATA:  Pulmonary embolism (PE) suspected, high prob Cough and shortness of breath.  Wheezing. EXAM: CT ANGIOGRAPHY CHEST WITH CONTRAST TECHNIQUE: Multidetector CT imaging of the chest was performed using the standard protocol during bolus administration of intravenous contrast. Multiplanar CT image reconstructions and MIPs were obtained to evaluate the vascular anatomy. RADIATION DOSE REDUCTION: This exam was performed according to the departmental dose-optimization program which includes automated exposure control, adjustment of the mA and/or kV according to patient size and/or use of iterative reconstruction technique. CONTRAST:  73m OMNIPAQUE IOHEXOL 350 MG/ML SOLN COMPARISON:  Radiograph earlier today.  Chest CT 11/14/2021 FINDINGS: Cardiovascular: There are no filling defects within the pulmonary arteries to suggest pulmonary embolus. Tortuosity and atherosclerosis of the thoracic aorta without acute aortic findings. The heart is normal in size. No pericardial effusion. Mediastinum/Nodes: 10 mm right infrahilar lymph node. 12 mm right hilar lymph node. The right lobe of the thyroid gland appears to be present. No esophageal wall thickening, small hiatal hernia. Lungs/Pleura: Severe bronchial thickening with areas of mucoid impaction.  Subsegmental atelectasis within the lower lobes. No confluent consolidation. No pleural fluid no pulmonary nodule or mass. No endobronchial debris. Upper Abdomen: No acute or unexpected findings. Musculoskeletal: Degenerative disc disease with mild Modic endplate changes. There are no acute or suspicious osseous abnormalities. No chest wall soft tissue abnormalities. Review of the MIP images confirms the above findings. IMPRESSION: 1. No pulmonary embolus. 2. Severe bronchial thickening with areas of mucoid impaction and subsegmental atelectasis. 3. Mildly enlarged right hilar lymph nodes are likely reactive. Aortic Atherosclerosis (ICD10-I70.0). Electronically Signed   By: MKeith RakeM.D.   On: 02/13/2022 21:39   DG Chest Portable 1 View  Result Date: 02/13/2022 CLINICAL DATA:  sob EXAM: PORTABLE CHEST 1 VIEW COMPARISON:  Chest x-ray 10/23/2021. chest x-ray 04/27/2016 FINDINGS: The heart and mediastinal contours are unchanged. Aortic calcification. Redemonstration of a chronic right  upper lobe calcified granuloma. No focal consolidation. No pulmonary edema. No pleural effusion. No pneumothorax. No acute osseous abnormality. IMPRESSION: 1. No active disease. 2.  Aortic Atherosclerosis (ICD10-I70.0). Electronically Signed   By: Iven Finn M.D.   On: 02/13/2022 17:24    Procedures Procedures    Medications Ordered in ED Medications  ipratropium-albuterol (DUONEB) 0.5-2.5 (3) MG/3ML nebulizer solution 3 mL (3 mLs Nebulization Given 02/13/22 1721)  magnesium sulfate IVPB 2 g 50 mL (0 g Intravenous Stopped 02/13/22 1813)  methylPREDNISolone sodium succinate (SOLU-MEDROL) 125 mg/2 mL injection 125 mg (125 mg Intravenous Given 02/13/22 1900)  iohexol (OMNIPAQUE) 350 MG/ML injection 70 mL (70 mLs Intravenous Contrast Given 02/13/22 2127)    ED Course/ Medical Decision Making/ A&P Clinical Course as of 02/13/22 2251  Wed Feb 13, 2022  1708 Patient received 2 DuoNebs and 125 of Solu-Medrol in route by  EMS.  Reports some improvement. [HS]  2237 88 year old female sent in from her facility for increased shortness of breath.  Audible wheezing on exam.  She is getting steroids and nebs by EMS and will continue getting some lab work chest x-ray EKG.  Disposition per results of testing. [MB]  8756 DG Chest Portable 1 View No acute process documented, no underlying pneumonia or or cardiomegaly [HS]  1749 EKG 12-Lead EKG shows QT prolongation but no acute ischemic arrhythmia.  Patient is on cardiac monitoring and is mildly tachycardic at 102 in the room [HS]  1824 CBC with Differential(!) Leukocytosis with left shift [HS]  4332 Basic metabolic panel(!) Mild hypokalemia at 3.3, slightly hyperglycemic but not in DKA. [HS]  9518 Brain natriuretic peptide BMP unremarkable, not consistent with new onset of CHF [HS]  1921 Patient reevaluated, she is resting comfortably.  Lung sounds are still somewhat diminished but the wheezing has significantly improved after DuoNebs and steroids and mag. [HS]  2144 CT Angio Chest PE W/Cm &/Or Wo Cm There are no signs of PE but there is areas of severe bronchial thickening and mucoid impaction with subsegmental atelectasis. [HS]  2207 Patient still has diminished lung sounds, she is wheezing again on reexamination.  She has had 5 DuoNebs, 250 of Solu-Medrol, mag and is still wheezing.  Although she does not have a history of COPD or asthma I do think she needs admission due to worsening shortness of breath and advanced age.  I will consult hospitalist for admission [HS]    Clinical Course User Index [HS] Sherrill Raring, PA-C [MB] Hayden Rasmussen, MD                           Medical Decision Making Amount and/or Complexity of Data Reviewed Labs: ordered. Decision-making details documented in ED Course. Radiology: ordered. Decision-making details documented in ED Course. ECG/medicine tests:  Decision-making details documented in ED Course.  Risk Prescription drug  management. Decision regarding hospitalization.   Patient presents due to shortness of breath and wheezing.  She is not hypoxic but has significantly diminished lung sounds and diffuse wheezing on exam.  No documented history of COPD.  I reviewed external medical records including urgent care visit earlier today.  I also spoke to patient's children who are independent historians.  Patient's been coughing for the last few days, lives in a care facility and people there have been sick.  Work-up documented above in ED course.  I viewed, ordered and interpreted laboratory work-up and imaging as documented there.  Given patient is still having  diffuse wheezing and decreased lung sounds after multiple DuoNebs, mag, Solu-Medrol I think she needs admission for worsening shortness of breath.  She likely would benefit from continued nebs and more steroids.  I will consult hospitalist for admission.        Final Clinical Impression(s) / ED Diagnoses Final diagnoses:  Shortness of breath  Acute respiratory distress    Rx / DC Orders ED Discharge Orders     None         Sherrill Raring, Hershal Coria 02/13/22 2251    Hayden Rasmussen, MD 02/14/22 1016

## 2022-02-13 NOTE — ED Triage Notes (Signed)
Pt BIB [facility] from [facility]. Pt lives at [facility] in [place]. Pt taken to [facility] due to cough and SOB x2 days. Audible wheezing noted, diminished lung sounds throughout. EMS notes pt was 99% on RA on arrival to [facility]. Pt placed on 3L via [place]. Given 1 neb, 2 duonebs, 125 mg solumedrol. BLE edema noted, daughter states is normal for her.

## 2022-02-14 DIAGNOSIS — G309 Alzheimer's disease, unspecified: Secondary | ICD-10-CM

## 2022-02-14 DIAGNOSIS — I10 Essential (primary) hypertension: Secondary | ICD-10-CM | POA: Diagnosis not present

## 2022-02-14 DIAGNOSIS — F0280 Dementia in other diseases classified elsewhere without behavioral disturbance: Secondary | ICD-10-CM

## 2022-02-14 DIAGNOSIS — R0602 Shortness of breath: Secondary | ICD-10-CM | POA: Diagnosis not present

## 2022-02-14 DIAGNOSIS — E039 Hypothyroidism, unspecified: Secondary | ICD-10-CM | POA: Diagnosis not present

## 2022-02-14 LAB — BASIC METABOLIC PANEL
Anion gap: 12 (ref 5–15)
BUN: 20 mg/dL (ref 8–23)
CO2: 23 mmol/L (ref 22–32)
Calcium: 8.8 mg/dL — ABNORMAL LOW (ref 8.9–10.3)
Chloride: 103 mmol/L (ref 98–111)
Creatinine, Ser: 1.04 mg/dL — ABNORMAL HIGH (ref 0.44–1.00)
GFR, Estimated: 49 mL/min — ABNORMAL LOW (ref >60)
Glucose, Bld: 216 mg/dL — ABNORMAL HIGH (ref 70–99)
Potassium: 4 mmol/L (ref 3.5–5.1)
Sodium: 138 mmol/L (ref 135–145)

## 2022-02-14 LAB — CBC
HCT: 36.6 % (ref 36.0–46.0)
Hemoglobin: 11.9 g/dL — ABNORMAL LOW (ref 12.0–15.0)
MCH: 29.1 pg (ref 26.0–34.0)
MCHC: 32.5 g/dL (ref 30.0–36.0)
MCV: 89.5 fL (ref 80.0–100.0)
Platelets: 245 10*3/uL (ref 150–400)
RBC: 4.09 MIL/uL (ref 3.87–5.11)
RDW: 14.5 % (ref 11.5–15.5)
WBC: 10.7 10*3/uL — ABNORMAL HIGH (ref 4.0–10.5)
nRBC: 0 % (ref 0.0–0.2)

## 2022-02-14 MED ORDER — AMLODIPINE BESYLATE 5 MG PO TABS
5.0000 mg | ORAL_TABLET | Freq: Every day | ORAL | Status: DC
  Administered 2022-02-14 – 2022-02-15 (×2): 5 mg via ORAL
  Filled 2022-02-14 (×2): qty 1

## 2022-02-14 MED ORDER — SODIUM CHLORIDE 3 % IN NEBU
4.0000 mL | INHALATION_SOLUTION | Freq: Two times a day (BID) | RESPIRATORY_TRACT | Status: DC
  Administered 2022-02-14 (×2): 4 mL via RESPIRATORY_TRACT
  Filled 2022-02-14 (×5): qty 4

## 2022-02-14 MED ORDER — IPRATROPIUM-ALBUTEROL 0.5-2.5 (3) MG/3ML IN SOLN
3.0000 mL | RESPIRATORY_TRACT | Status: DC | PRN
  Administered 2022-02-15: 3 mL via RESPIRATORY_TRACT
  Filled 2022-02-14: qty 3

## 2022-02-14 MED ORDER — RIVASTIGMINE 13.3 MG/24HR TD PT24
13.3000 mg | MEDICATED_PATCH | Freq: Every day | TRANSDERMAL | Status: DC
  Administered 2022-02-14 – 2022-02-15 (×2): 13.3 mg via TRANSDERMAL
  Filled 2022-02-14 (×3): qty 1

## 2022-02-14 MED ORDER — METHYLPREDNISOLONE SODIUM SUCC 125 MG IJ SOLR
80.0000 mg | INTRAMUSCULAR | Status: DC
  Administered 2022-02-14 – 2022-02-15 (×2): 80 mg via INTRAVENOUS
  Filled 2022-02-14 (×2): qty 2

## 2022-02-14 MED ORDER — RACEPINEPHRINE HCL 2.25 % IN NEBU: 0.5000 mL | INHALATION_SOLUTION | Freq: Once | RESPIRATORY_TRACT | Status: DC

## 2022-02-14 MED ORDER — ONDANSETRON HCL 4 MG PO TABS: 4.0000 mg | ORAL_TABLET | Freq: Four times a day (QID) | ORAL | Status: DC | PRN

## 2022-02-14 MED ORDER — RIVASTIGMINE 13.3 MG/24HR TD PT24
13.3000 mg | MEDICATED_PATCH | Freq: Every day | TRANSDERMAL | Status: DC
Start: 2022-02-14 — End: 2022-02-14

## 2022-02-14 MED ORDER — POLYETHYLENE GLYCOL 3350 17 G PO PACK
17.0000 g | PACK | Freq: Every day | ORAL | Status: DC | PRN
Start: 2022-02-14 — End: 2022-02-15

## 2022-02-14 MED ORDER — LEVOTHYROXINE SODIUM 50 MCG PO TABS
50.0000 ug | ORAL_TABLET | Freq: Every day | ORAL | Status: DC
  Administered 2022-02-14 – 2022-02-15 (×2): 50 ug via ORAL
  Filled 2022-02-14 (×2): qty 1

## 2022-02-14 MED ORDER — DOXYCYCLINE HYCLATE 100 MG PO TABS
100.0000 mg | ORAL_TABLET | Freq: Two times a day (BID) | ORAL | Status: DC
  Administered 2022-02-14 – 2022-02-15 (×4): 100 mg via ORAL
  Filled 2022-02-14 (×5): qty 1

## 2022-02-14 MED ORDER — ONDANSETRON HCL 4 MG/2ML IJ SOLN: 4.0000 mg | Freq: Four times a day (QID) | INTRAMUSCULAR | Status: DC | PRN

## 2022-02-14 MED ORDER — RACEPINEPHRINE HCL 2.25 % IN NEBU
0.5000 mL | INHALATION_SOLUTION | Freq: Once | RESPIRATORY_TRACT | Status: AC
Start: 2022-02-14 — End: 2022-02-14
  Administered 2022-02-14: 0.5 mL via RESPIRATORY_TRACT
  Filled 2022-02-14: qty 0.5

## 2022-02-14 MED ORDER — PREDNISONE 20 MG PO TABS
40.0000 mg | ORAL_TABLET | Freq: Every day | ORAL | Status: DC
  Administered 2022-02-14: 40 mg via ORAL
  Filled 2022-02-14: qty 2

## 2022-02-14 MED ORDER — ACETAMINOPHEN 650 MG RE SUPP: 650.0000 mg | Freq: Four times a day (QID) | RECTAL | Status: DC | PRN

## 2022-02-14 MED ORDER — ACETAMINOPHEN 325 MG PO TABS: 650.0000 mg | ORAL_TABLET | Freq: Four times a day (QID) | ORAL | Status: DC | PRN

## 2022-02-14 MED ORDER — VITAMIN B-12 1000 MCG PO TABS
1000.0000 ug | ORAL_TABLET | Freq: Every day | ORAL | Status: DC
Start: 2022-02-14 — End: 2022-02-15
  Administered 2022-02-14 – 2022-02-15 (×2): 1000 ug via ORAL
  Filled 2022-02-14 (×2): qty 1

## 2022-02-14 MED ORDER — ENOXAPARIN SODIUM 40 MG/0.4ML IJ SOSY
40.0000 mg | PREFILLED_SYRINGE | INTRAMUSCULAR | Status: DC
  Administered 2022-02-14 – 2022-02-15 (×2): 40 mg via SUBCUTANEOUS
  Filled 2022-02-14 (×2): qty 0.4

## 2022-02-14 MED ORDER — GUAIFENESIN ER 600 MG PO TB12
600.0000 mg | ORAL_TABLET | Freq: Two times a day (BID) | ORAL | Status: DC
  Administered 2022-02-14 – 2022-02-15 (×4): 600 mg via ORAL
  Filled 2022-02-14 (×5): qty 1

## 2022-02-14 MED ORDER — IPRATROPIUM-ALBUTEROL 0.5-2.5 (3) MG/3ML IN SOLN
3.0000 mL | Freq: Four times a day (QID) | RESPIRATORY_TRACT | Status: DC | PRN
  Administered 2022-02-14 (×2): 3 mL via RESPIRATORY_TRACT
  Filled 2022-02-14 (×3): qty 3

## 2022-02-14 MED ORDER — POTASSIUM CHLORIDE 20 MEQ PO PACK
40.0000 meq | PACK | Freq: Once | ORAL | Status: AC
  Administered 2022-02-14: 40 meq via ORAL
  Filled 2022-02-14: qty 2

## 2022-02-14 NOTE — ED Notes (Signed)
Patient awoken for morning labs, noted to have continued wheezing. PRN breathing tx started.

## 2022-02-14 NOTE — Progress Notes (Addendum)
PROGRESS NOTE    Rebecca Travis  GEZ:[id number] DOB: 01-25-1925 DOA: 02/13/2022 PCP: Lajean Manes, MD    Brief Narrative:  Rebecca Travis is a 88 y.o. female with medical history of hypertension, hyperlipidemia, hypothyroidism, history of subdural hematoma status postcraniotomy and evacuation in 2016, dementia from [place] living facility presented to the hospital with shortness of breath and dyspnea with audible wheezing and nonproductive cough.  Patient was a poor historian dementia.  In the ED patient was mildly tachycardic, WBC was 14.6.  BNP 68.  Troponin was flat.  COVID and influenza was negative.  Chest x-ray was negative for focal consolidation but chronic right upper lobe calcified granuloma was noted.  CTA chest was negative for PE but with severe bronchial thickening with areas of mucoid impaction and subsegmental atelectasis were noted.  Patient was given Solu-Medrol, magnesium 2 g, DuoNebs and was admitted to hospital for further evaluation and treatment.    Assessment and Plan: Principal Problem:   Shortness of breath Active Problems:   Hypertension   Hypothyroidism   Dementia (HCC)   Shortness of breath with increasing wheezing and mention of stridor this morning. Rapid response was called in this morning.  Patient was given [place] epinephrine, prednisone changed to IV Solu-Medrol at this time.  CTA of the chest negative for PE but bronchial thickening and mucoid impaction with atelectasis.  We will continue with incentive spirometry and supportive care.  Still has some mild wheezing and dyspnea.  Continue hypertonic saline nebulizers BID and DuoNeb as needed.  On oral doxycycline for underlying bronchitis.  Prednisone has been changed to IV Solu-Medrol which will be continued.  Continue Mucinex, supplemental oxygen if needed.  Patient would benefit from ongoing observation tonight for further stridor and respiratory status.  Hypertension Continue  amlodipine.  Hypothyroidism Continue Synthroid  Dementia (HCC) Continue Exelon, delirium precautions.   DVT prophylaxis: enoxaparin (LOVENOX) injection 40 mg Start: 02/14/22 1000   Code Status:     Code Status: DNR  Disposition: [facility] care likely in 1-2 days.   Status is: Observation  The patient will require care spanning > 2 midnights and should be moved to inpatient because: Stridor, IV steroids, further observation   Family Communication:  I spoke with the patient's daughter on the phone and updated her about the clinical condition of the patient.  Consultants:  None  Procedures:  None  Antimicrobials:  Doxycycline  Anti-infectives (From admission, onward)    Start     Dose/Rate Route Frequency Ordered Stop   02/14/22 0045  doxycycline (VIBRA-TABS) tablet 100 mg        100 mg Oral Every 12 hours 02/14/22 0031        Subjective: Today, patient was seen and examined at bedside.  Rapid response was called in this morning for increasing stridor and wheezing.  Patient had received racemic epinephrine.  Complains of mild cough with productive sputum and noted to have some wheezing  Objective: Vitals:   02/14/22 1000 02/14/22 1100 02/14/22 1200 02/14/22 1300  BP: (!) 142/97 (!) 129/105 125/85 (!) 108/45  Pulse: 97 90 75 85  Resp: 20 (!) '22 20 16  '$ Temp:      TempSrc:      SpO2: 100% 99% 99% 99%  Weight:      Height:       No intake or output data in the 24 hours ending 02/14/22 1410 Filed Weights   02/14/22 0000  Weight: 78.5 kg  Physical Examination: Body mass index is 29.7 kg/m.   General:  Average built, not in obvious distress, underlying dementia, HENT:   No scleral pallor or icterus noted. Oral mucosa is moist.  Chest:  .  Diminished breath sounds bilaterally.  Wheezes noted.  No stridor at the time of my exam CVS: S1 &S2 heard. No murmur.  Regular rate and rhythm. Abdomen: Soft, nontender, nondistended.  Bowel  sounds are heard.   Extremities: No cyanosis, clubbing or edema.  Peripheral pulses are palpable. Psych: Alert, awake and communicative.  Has advanced dementia, oriented to place only. CNS:  No cranial nerve deficits.  Moves extremities Skin: Warm and dry.  No rashes noted.  Data Reviewed:   CBC: Recent Labs  Lab 02/13/22 1722 02/14/22 0440  WBC 14.6* 10.7*  NEUTROABS 8.3*  --   HGB 13.0 11.9*  HCT 40.5 36.6  MCV 90.6 89.5  PLT 267 191    Basic Metabolic Panel: Recent Labs  Lab 02/13/22 1722 02/14/22 0440  NA 140 138  K 3.3* 4.0  CL 103 103  CO2 24 23  GLUCOSE 154* 216*  BUN 18 20  CREATININE 0.98 1.04*  CALCIUM 9.0 8.8*    Liver Function Tests: No results for input(s): "AST", "ALT", "ALKPHOS", "BILITOT", "PROT", "ALBUMIN" in the last 168 hours.   Radiology Studies: CT Angio Chest PE W/Cm &/Or Wo Cm  Result Date: 02/13/2022 CLINICAL DATA:  Pulmonary embolism (PE) suspected, high prob Cough and shortness of breath.  Wheezing. EXAM: CT ANGIOGRAPHY CHEST WITH CONTRAST TECHNIQUE: Multidetector CT imaging of the chest was performed using the standard protocol during bolus administration of intravenous contrast. Multiplanar CT image reconstructions and MIPs were obtained to evaluate the vascular anatomy. RADIATION DOSE REDUCTION: This exam was performed according to the departmental dose-optimization program which includes automated exposure control, adjustment of the mA and/or kV according to patient size and/or use of iterative reconstruction technique. CONTRAST:  72m OMNIPAQUE IOHEXOL 350 MG/ML SOLN COMPARISON:  Radiograph earlier today.  Chest CT 11/14/2021 FINDINGS: Cardiovascular: There are no filling defects within the pulmonary arteries to suggest pulmonary embolus. Tortuosity and atherosclerosis of the thoracic aorta without acute aortic findings. The heart is normal in size. No pericardial effusion. Mediastinum/Nodes: 10 mm right infrahilar lymph node. 12 mm right hilar  lymph node. The right lobe of the thyroid gland appears to be present. No esophageal wall thickening, small hiatal hernia. Lungs/Pleura: Severe bronchial thickening with areas of mucoid impaction. Subsegmental atelectasis within the lower lobes. No confluent consolidation. No pleural fluid no pulmonary nodule or mass. No endobronchial debris. Upper Abdomen: No acute or unexpected findings. Musculoskeletal: Degenerative disc disease with mild Modic endplate changes. There are no acute or suspicious osseous abnormalities. No chest wall soft tissue abnormalities. Review of the MIP images confirms the above findings. IMPRESSION: 1. No pulmonary embolus. 2. Severe bronchial thickening with areas of mucoid impaction and subsegmental atelectasis. 3. Mildly enlarged right hilar lymph nodes are likely reactive. Aortic Atherosclerosis (ICD10-I70.0). Electronically Signed   By: MKeith RakeM.D.   On: 02/13/2022 21:39   DG Chest Portable 1 View  Result Date: 02/13/2022 CLINICAL DATA:  sob EXAM: PORTABLE CHEST 1 VIEW COMPARISON:  Chest x-ray 10/23/2021. chest x-ray 04/27/2016 FINDINGS: The heart and mediastinal contours are unchanged. Aortic calcification. Redemonstration of a chronic right upper lobe calcified granuloma. No focal consolidation. No pulmonary edema. No pleural effusion. No pneumothorax. No acute osseous abnormality. IMPRESSION: 1. No active disease. 2.  Aortic Atherosclerosis (ICD10-I70.0). Electronically Signed  By: Iven Finn M.D.   On: 02/13/2022 17:24      LOS: 0 days    Flora Lipps, MD [facility] Available via Epic secure chat 7am-7pm After these hours, please refer to coverage provider listed on [email or web address] 02/14/2022, 2:10 PM

## 2022-02-14 NOTE — Assessment & Plan Note (Signed)
CTA negative for PE but shows severe bronchial thickening with mucoid impaction and lower lobe atelectasis.  No focal consolidation, pulmonary nodule, or mass.  SARS-CoV-2 and influenza PCRs are negative.  Leukocytosis of 14.6 present on admission.  Still with continued dyspnea and upper airway wheezing but saturating well on room air. -Start on hypertonic saline nebulizers BID and DuoNeb as needed -Start oral doxycycline 100 mg twice daily -Start prednisone 40 mg daily -Mucinex 600 mg twice daily -Supplemental oxygen as needed -Incentive spirometer and flutter valve

## 2022-02-14 NOTE — ED Notes (Signed)
RT at bedside due to increased wheezing.

## 2022-02-14 NOTE — Assessment & Plan Note (Signed)
-  Continue home amlodipine 

## 2022-02-14 NOTE — ED Notes (Signed)
Cleaned up patient put another brief on patient is resting with call bell in reach

## 2022-02-14 NOTE — Progress Notes (Signed)
Stridor noted. Dr notified and racemic epi given

## 2022-02-14 NOTE — ED Notes (Signed)
Patient denies pain and is resting comfortably.  

## 2022-02-14 NOTE — ED Notes (Signed)
Patch on patients back is for dementia

## 2022-02-14 NOTE — ED Notes (Signed)
Breakfast ordered 

## 2022-02-14 NOTE — Assessment & Plan Note (Signed)
-   Continue home Synthroid °

## 2022-02-14 NOTE — Hospital Course (Signed)
Rebecca Travis is a 88 y.o. female with medical history significant for HTN, HLD, hypothyroidism, SDH s/p craniotomy/evacuation 03/2015, dementia who is admitted with shortness of breath.

## 2022-02-14 NOTE — ED Notes (Addendum)
Patient wheezing has not improved since last breathing treatment. MD made aware awaiting new orders and callback.

## 2022-02-14 NOTE — ED Provider Notes (Signed)
Patient awaiting admission.  She is awake, alert, sitting upright, with nonrebreather in place, has some secretions, respiratory requests racemic epinephrine, suctioning, this was facilitated.  She is in no distress, is requesting food.   Carmin Muskrat, MD 02/14/22 [phone]

## 2022-02-14 NOTE — ED Notes (Signed)
Breathing tx started

## 2022-02-14 NOTE — Assessment & Plan Note (Signed)
Continue home Exelon, delirium precautions.

## 2022-02-14 NOTE — ED Notes (Signed)
Help straighten and pull patient up in the bed patient is resting with call bell in reach got patient a warm blanket

## 2022-02-14 NOTE — ED Notes (Addendum)
MD Rathore callback with orders to continue monitoring patient. If patient starts presenting with hypoxic ss to notify right away.

## 2022-02-15 DIAGNOSIS — Z7982 Long term (current) use of aspirin: Secondary | ICD-10-CM | POA: Diagnosis not present

## 2022-02-15 DIAGNOSIS — Z79890 Hormone replacement therapy (postmenopausal): Secondary | ICD-10-CM | POA: Diagnosis not present

## 2022-02-15 DIAGNOSIS — M1990 Unspecified osteoarthritis, unspecified site: Secondary | ICD-10-CM | POA: Diagnosis present

## 2022-02-15 DIAGNOSIS — J209 Acute bronchitis, unspecified: Secondary | ICD-10-CM | POA: Diagnosis not present

## 2022-02-15 DIAGNOSIS — K219 Gastro-esophageal reflux disease without esophagitis: Secondary | ICD-10-CM | POA: Diagnosis present

## 2022-02-15 DIAGNOSIS — R0603 Acute respiratory distress: Secondary | ICD-10-CM | POA: Diagnosis present

## 2022-02-15 DIAGNOSIS — Z79899 Other long term (current) drug therapy: Secondary | ICD-10-CM | POA: Diagnosis not present

## 2022-02-15 DIAGNOSIS — Z66 Do not resuscitate: Secondary | ICD-10-CM | POA: Diagnosis present

## 2022-02-15 DIAGNOSIS — Z20822 Contact with and (suspected) exposure to covid-19: Secondary | ICD-10-CM | POA: Diagnosis present

## 2022-02-15 DIAGNOSIS — Z87891 Personal history of nicotine dependence: Secondary | ICD-10-CM | POA: Diagnosis not present

## 2022-02-15 DIAGNOSIS — R0602 Shortness of breath: Secondary | ICD-10-CM | POA: Diagnosis present

## 2022-02-15 DIAGNOSIS — J9811 Atelectasis: Secondary | ICD-10-CM | POA: Diagnosis present

## 2022-02-15 DIAGNOSIS — Z888 Allergy status to other drugs, medicaments and biological substances status: Secondary | ICD-10-CM | POA: Diagnosis not present

## 2022-02-15 DIAGNOSIS — Z885 Allergy status to narcotic agent status: Secondary | ICD-10-CM | POA: Diagnosis not present

## 2022-02-15 DIAGNOSIS — E890 Postprocedural hypothyroidism: Secondary | ICD-10-CM | POA: Diagnosis present

## 2022-02-15 DIAGNOSIS — E039 Hypothyroidism, unspecified: Secondary | ICD-10-CM | POA: Diagnosis not present

## 2022-02-15 DIAGNOSIS — I10 Essential (primary) hypertension: Secondary | ICD-10-CM | POA: Diagnosis not present

## 2022-02-15 DIAGNOSIS — F0390 Unspecified dementia without behavioral disturbance: Secondary | ICD-10-CM | POA: Diagnosis present

## 2022-02-15 DIAGNOSIS — Z881 Allergy status to other antibiotic agents status: Secondary | ICD-10-CM | POA: Diagnosis not present

## 2022-02-15 DIAGNOSIS — E785 Hyperlipidemia, unspecified: Secondary | ICD-10-CM | POA: Diagnosis present

## 2022-02-15 LAB — CBC
HCT: 36.2 % (ref 36.0–46.0)
Hemoglobin: 11.8 g/dL — ABNORMAL LOW (ref 12.0–15.0)
MCH: 28.9 pg (ref 26.0–34.0)
MCHC: 32.6 g/dL (ref 30.0–36.0)
MCV: 88.5 fL (ref 80.0–100.0)
Platelets: 249 10*3/uL (ref 150–400)
RBC: 4.09 MIL/uL (ref 3.87–5.11)
RDW: 14.5 % (ref 11.5–15.5)
WBC: 15.2 10*3/uL — ABNORMAL HIGH (ref 4.0–10.5)
nRBC: 0 % (ref 0.0–0.2)

## 2022-02-15 LAB — BASIC METABOLIC PANEL
Anion gap: 10 (ref 5–15)
BUN: 25 mg/dL — ABNORMAL HIGH (ref 8–23)
CO2: 23 mmol/L (ref 22–32)
Calcium: 8.9 mg/dL (ref 8.9–10.3)
Chloride: 107 mmol/L (ref 98–111)
Creatinine, Ser: 0.91 mg/dL (ref 0.44–1.00)
GFR, Estimated: 58 mL/min — ABNORMAL LOW (ref >60)
Glucose, Bld: 165 mg/dL — ABNORMAL HIGH (ref 70–99)
Potassium: 4.5 mmol/L (ref 3.5–5.1)
Sodium: 140 mmol/L (ref 135–145)

## 2022-02-15 LAB — MAGNESIUM: Magnesium: 2.2 mg/dL (ref 1.7–2.4)

## 2022-02-15 MED ORDER — PREDNISONE 10 MG PO TABS: ORAL_TABLET | ORAL | 0 refills | Status: DC

## 2022-02-15 MED ORDER — DOXYCYCLINE HYCLATE 100 MG PO TABS: 100.0000 mg | ORAL_TABLET | Freq: Two times a day (BID) | ORAL | 0 refills | Status: AC

## 2022-02-15 MED ORDER — POLYETHYLENE GLYCOL 3350 17 G PO PACK: 17.0000 g | PACK | Freq: Every day | ORAL | 0 refills | Status: AC | PRN

## 2022-02-15 MED ORDER — IPRATROPIUM-ALBUTEROL 0.5-2.5 (3) MG/3ML IN SOLN
3.0000 mL | RESPIRATORY_TRACT | Status: DC | PRN
Start: 2022-02-15 — End: 2022-02-15
  Administered 2022-02-15: 3 mL via RESPIRATORY_TRACT
  Filled 2022-02-15: qty 3

## 2022-02-15 MED ORDER — MUCINEX 600 MG PO TB12: 600.0000 mg | ORAL_TABLET | Freq: Two times a day (BID) | ORAL | 0 refills | Status: AC

## 2022-02-15 MED ORDER — ALBUTEROL SULFATE HFA 108 (90 BASE) MCG/ACT IN AERS: 2.0000 | INHALATION_SPRAY | Freq: Four times a day (QID) | RESPIRATORY_TRACT | 0 refills | Status: AC | PRN

## 2022-02-15 MED ORDER — RACEPINEPHRINE HCL 2.25 % IN NEBU
0.5000 mL | INHALATION_SOLUTION | Freq: Once | RESPIRATORY_TRACT | Status: AC
  Administered 2022-02-15: 0.5 mL via RESPIRATORY_TRACT
  Filled 2022-02-15: qty 0.5

## 2022-02-15 NOTE — Discharge Summary (Signed)
Physician Discharge Summary   Patient: Rebecca Travis MRN: [id number] DOB: December 09, 1924  Admit date:     02/13/2022  Discharge date: 02/15/22  Discharge Physician: Oswald Hillock   PCP: Lajean Manes, MD   Recommendations at discharge:  {Tip this will not be part of the note when signed- Example include specific recommendations for outpatient follow-up, pending tests to follow-up on.  Follow-up PCP in 2 weeks  Discharge Diagnoses: Principal Problem:   Shortness of breath Active Problems:   Hypertension   Hypothyroidism   Dementia ([place])   Acute bronchitis  Resolved Problems:   * No resolved hospital problems. *  Hospital Course:  Rebecca Travis is a 88 y.o. female with medical history significant for HTN, HLD, hypothyroidism, SDH s/p craniotomy/evacuation 03/2015, dementia who is admitted with shortness of breath.  Assessment and Plan:  * Shortness of breath CTA negative for PE but shows severe bronchial thickening with mucoid impaction and lower lobe atelectasis.  No focal consolidation, pulmonary nodule, or mass.  SARS-CoV-2 and influenza PCRs are negative.   Leukocytosis of 14.6 present on admission.   -Started on hypertonic saline nebulizers BID and DuoNeb as needed -Started oral doxycycline 100 mg twice daily -Started on IV Solu-Medrol -Mucinex 600 mg twice daily -Supplemental oxygen as needed -Patient is no longer requiring oxygen -We will discharge patient on doxycycline 100 mg p.o. twice daily, prednisone taper for 5 days, albuterol inhaler 2 puffs every 6 hours as needed for shortness of breath  Leukocytosis -WBC elevated to 15,000 -Secondary to steroids  Hypertension Continue home amlodipine.  Hypothyroidism Continue home Synthroid.  Dementia ([place]) Continue home Exelon         Consultants:  Procedures performed:  Disposition: Home Diet recommendation:  Regular diet DISCHARGE MEDICATION: Allergies as of 02/15/2022       Reactions    Aricept [donepezil Hcl] Other (See Comments)   nightmares   Benzonatate Diarrhea   Cefuroxime Diarrhea   Donepezil Other (See Comments)   NIGHTMARES   Fish Oil Diarrhea, Other (See Comments)   EXCESSIVE DIARRHEA   Gabapentin Other (See Comments)   DROWSINESS   Statins Other (See Comments)   MYALGIAS   Ezetimibe Other (See Comments)   Loratadine Other (See Comments)   Tramadol Other (See Comments)        Medication List     TAKE these medications    albuterol 108 (90 Base) MCG/ACT inhaler Commonly known as: VENTOLIN HFA Inhale 2 puffs into the lungs every 6 (six) hours as needed for wheezing or shortness of breath.   amLODipine 5 MG tablet Commonly known as: NORVASC Take 5 mg by mouth daily.   aspirin 325 MG tablet Take 325 mg by mouth daily.   cyanocobalamin 1000 MCG tablet Take 1 tablet (1,000 mcg total) by mouth daily.   doxycycline 100 MG tablet Commonly known as: VIBRA-TABS Take 1 tablet (100 mg total) by mouth every 12 (twelve) hours for 5 days.   Florastor 250 MG capsule Generic drug: saccharomyces boulardii Take 250 mg by mouth daily.   Mucinex 600 MG 12 hr tablet Generic drug: guaiFENesin Take 1 tablet (600 mg total) by mouth 2 (two) times daily for 7 days.   polyethylene glycol 17 g packet Commonly known as: MIRALAX / GLYCOLAX Take 17 g by mouth daily as needed for mild constipation.   predniSONE 10 MG tablet Commonly known as: DELTASONE Prednisone 40 mg po daily x 1 day then Prednisone 30 mg po daily x 1 day  then Prednisone 20 mg po daily x 1 day then Prednisone 10 mg daily x 1 day then stop...   rivastigmine 13.3 MG/24HR Commonly known as: EXELON Place 13.3 mg onto the skin daily.   Synthroid 50 MCG tablet Generic drug: levothyroxine Take 50 mcg by mouth daily before breakfast.        Discharge Exam: Filed Weights   02/14/22 0000  Weight: 78.5 kg   General-appears in no acute distress Heart-S1-S2, regular, no murmur  auscultated Lungs-clear to auscultation bilaterally, no wheezing or crackles auscultated Abdomen-soft, nontender, no organomegaly Extremities-no edema in the lower extremities Neuro-alert, oriented to self and place only  Condition at discharge: good  The results of significant diagnostics from this hospitalization (including imaging, microbiology, ancillary and laboratory) are listed below for reference.   Imaging Studies: CT Angio Chest PE W/Cm &/Or Wo Cm  Result Date: 02/13/2022 CLINICAL DATA:  Pulmonary embolism (PE) suspected, high prob Cough and shortness of breath.  Wheezing. EXAM: CT ANGIOGRAPHY CHEST WITH CONTRAST TECHNIQUE: Multidetector CT imaging of the chest was performed using the standard protocol during bolus administration of intravenous contrast. Multiplanar CT image reconstructions and MIPs were obtained to evaluate the vascular anatomy. RADIATION DOSE REDUCTION: This exam was performed according to the departmental dose-optimization program which includes automated exposure control, adjustment of the mA and/or kV according to patient size and/or use of iterative reconstruction technique. CONTRAST:  33m OMNIPAQUE IOHEXOL 350 MG/ML SOLN COMPARISON:  Radiograph earlier today.  Chest CT 11/14/2021 FINDINGS: Cardiovascular: There are no filling defects within the pulmonary arteries to suggest pulmonary embolus. Tortuosity and atherosclerosis of the thoracic aorta without acute aortic findings. The heart is normal in size. No pericardial effusion. Mediastinum/Nodes: 10 mm right infrahilar lymph node. 12 mm right hilar lymph node. The right lobe of the thyroid gland appears to be present. No esophageal wall thickening, small hiatal hernia. Lungs/Pleura: Severe bronchial thickening with areas of mucoid impaction. Subsegmental atelectasis within the lower lobes. No confluent consolidation. No pleural fluid no pulmonary nodule or mass. No endobronchial debris. Upper Abdomen: No acute or  unexpected findings. Musculoskeletal: Degenerative disc disease with mild Modic endplate changes. There are no acute or suspicious osseous abnormalities. No chest wall soft tissue abnormalities. Review of the MIP images confirms the above findings. IMPRESSION: 1. No pulmonary embolus. 2. Severe bronchial thickening with areas of mucoid impaction and subsegmental atelectasis. 3. Mildly enlarged right hilar lymph nodes are likely reactive. Aortic Atherosclerosis (ICD10-I70.0). Electronically Signed   By: MKeith RakeM.D.   On: 02/13/2022 21:39   DG Chest Portable 1 View  Result Date: 02/13/2022 CLINICAL DATA:  sob EXAM: PORTABLE CHEST 1 VIEW COMPARISON:  Chest x-ray 10/23/2021. chest x-ray 04/27/2016 FINDINGS: The heart and mediastinal contours are unchanged. Aortic calcification. Redemonstration of a chronic right upper lobe calcified granuloma. No focal consolidation. No pulmonary edema. No pleural effusion. No pneumothorax. No acute osseous abnormality. IMPRESSION: 1. No active disease. 2.  Aortic Atherosclerosis (ICD10-I70.0). Electronically Signed   By: MIven FinnM.D.   On: 02/13/2022 17:24    Microbiology: Results for orders placed or performed during the hospital encounter of 02/13/22  Resp Panel by RT-PCR (Flu A&B, Covid) Anterior Nasal Swab     Status: None   Collection Time: 02/13/22  7:47 PM   Specimen: Anterior Nasal Swab  Result Value Ref Range Status   SARS Coronavirus 2 by RT PCR NEGATIVE NEGATIVE Final    Comment: (NOTE) SARS-CoV-2 target nucleic acids are NOT DETECTED.  The SARS-CoV-2  RNA is generally detectable in upper respiratory specimens during the acute phase of infection. The lowest concentration of SARS-CoV-2 viral copies this assay can detect is 138 copies/mL. A negative result does not preclude SARS-Cov-2 infection and should not be used as the sole basis for treatment or other patient management decisions. A negative result may occur with  improper specimen  collection/handling, submission of specimen other than nasopharyngeal swab, presence of viral mutation(s) within the areas targeted by this assay, and inadequate number of viral copies(<138 copies/mL). A negative result must be combined with clinical observations, patient history, and epidemiological information. The expected result is Negative.  Fact Sheet for Patients:  EntrepreneurPulse.com.au  Fact Sheet for Healthcare Providers:  IncredibleEmployment.be  This test is no t yet approved or cleared by the [place] FDA and  has been authorized for detection and/or diagnosis of SARS-CoV-2 by FDA under an Emergency Use Authorization (EUA). This EUA will remain  in effect (meaning this test can be used) for the duration of the COVID-19 declaration under Section 564(b)(1) of the Act, 21 U.S.C.section 360bbb-3(b)(1), unless the authorization is terminated  or revoked sooner.       Influenza A by PCR NEGATIVE NEGATIVE Final   Influenza B by PCR NEGATIVE NEGATIVE Final    Comment: (NOTE) The Xpert Xpress SARS-CoV-2/FLU/RSV plus assay is intended as an aid in the diagnosis of influenza from Nasopharyngeal swab specimens and should not be used as a sole basis for treatment. Nasal washings and aspirates are unacceptable for Xpert Xpress SARS-CoV-2/FLU/RSV testing.  Fact Sheet for Patients: EntrepreneurPulse.com.au  Fact Sheet for Healthcare Providers: IncredibleEmployment.be  This test is not yet approved or cleared by the [place] FDA and has been authorized for detection and/or diagnosis of SARS-CoV-2 by FDA under an Emergency Use Authorization (EUA). This EUA will remain in effect (meaning this test can be used) for the duration of the COVID-19 declaration under Section 564(b)(1) of the Act, 21 U.S.C. section 360bbb-3(b)(1), unless the authorization is terminated or revoked.  Performed at [facility], [place] [address], [place], [place] [address]     Labs: CBC: Recent Labs  Lab 02/13/22 1722 02/14/22 0440 02/15/22 0143  WBC 14.6* 10.7* 15.2*  NEUTROABS 8.3*  --   --   HGB 13.0 11.9* 11.8*  HCT 40.5 36.6 36.2  MCV 90.6 89.5 88.5  PLT 267 245 027   Basic Metabolic Panel: Recent Labs  Lab 02/13/22 1722 02/14/22 0440 02/15/22 0143  NA 140 138 140  K 3.3* 4.0 4.5  CL 103 103 107  CO2 '24 23 23  '$ GLUCOSE 154* 216* 165*  BUN 18 20 25*  CREATININE 0.98 1.04* 0.91  CALCIUM 9.0 8.8* 8.9  MG  --   --  2.2   Liver Function Tests: No results for input(s): "AST", "ALT", "ALKPHOS", "BILITOT", "PROT", "ALBUMIN" in the last 168 hours. CBG: No results for input(s): "GLUCAP" in the last 168 hours.  Discharge time spent: greater than 30 minutes.  Signed: Oswald Hillock, MD [facility] 02/15/2022

## 2022-02-15 NOTE — TOC Initial Note (Signed)
Transition of Care [facility]) - Initial/Assessment Note    Patient Details  Name: Rebecca Travis MRN: [id number] Date of Birth: August 15, 1924  [facility] CM/SW Contact:    Joanne Chars, LCSW Phone Number: 02/15/2022, 12:47 PM  Clinical Narrative:     Pt oriented x1, CSW spoke with pt daughter Joseph Art in room.  Pt is from [facility] care, receiving PT services there. PT eval pending here at the hospital, discussed possible recommendations.  Daughter does not want to consider other placement at SNF even if recommended, does want to see results of PT eval.  Daughter is planning to transport pt back to [place].  CSW spoke with [place] at [place], [phone]. she requested DC summary be faxed to [phone], which was done.               Expected Discharge Plan: Memory Care Barriers to Discharge: No Barriers Identified   Patient Goals and CMS Choice     Choice offered to / list presented to : Adult Children (daughter Joseph Art in room)  Expected Discharge Plan and Services Expected Discharge Plan: Memory Care In-house Referral: Clinical Social Work   Post Acute Care Choice: Resumption of Svcs/PTA Provider (Memory Care, [facility] on [address]) Living arrangements for the past 2 months: [place]                                      Prior Living Arrangements/Services Living arrangements for the past 2 months: [place] Lives with:: Facility Resident          Need for Family Participation in Patient Care: Yes (Comment) Care giver support system in place?: Yes (comment) Current home services: Home PT Criminal Activity/Legal Involvement Pertinent to Current Situation/Hospitalization: No - Comment as needed  Activities of Daily Living      Permission Sought/Granted                  Emotional Assessment Appearance:: Appears stated age Attitude/Demeanor/Rapport: Engaged Affect (typically  observed): Pleasant Orientation: : Oriented to Self Alcohol / Substance Use: Not Applicable Psych Involvement: No (comment)  Admission diagnosis:  Shortness of breath [R06.02] Acute respiratory distress [R06.03] Acute bronchitis [J20.9] Patient Active Problem List   Diagnosis Date Noted   Acute bronchitis 02/15/2022   Hypothyroidism    Hypertension    Shortness of breath 02/13/2022   AKI (acute kidney injury) ([place]) 09/28/2019   Risk for falls 99/83/3825   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

## 2022-02-15 NOTE — Plan of Care (Signed)

## 2022-02-15 NOTE — Care Management Obs Status (Signed)
[facility] NOTIFICATION   Patient Details  Name: Rebecca Travis MRN: [id number] Date of Birth: [birth year]   Medicare Observation Status Notification Given:  Yes  CSW spoke with pt daughter Joseph Art by phone, letter placed in room.  Joanne Chars, LCSW 02/15/2022, 8:42 AM

## 2022-02-15 NOTE — Evaluation (Addendum)
Physical Therapy Evaluation Patient Details Name: Rebecca Travis MRN: [id number] DOB: 06/13/1925 Today's Date: 02/15/2022  History of Present Illness  88 y/o female presented to ED on 02/13/22 for SOB x 2 days, wheezing, cough. CTA negative for PE but showed bronchial thickening and lower lobe atelectasis. PMH: HTN, dementia, hx of SDH s/p craniotomy and evacuation in 2016.  Clinical Impression  Patient admitted with the above. PTA, patient from memory care unit and required assistance for ADLs but ambulatory with rollator around facility. Patient presents with weakness, impaired balance, and decreased activity tolerance. VSS on RA throughout mobility. Patient required min guard for sit to stand and ambulation in hallway with use of RW. LOB x 2 during mobility but patient able to self correct. Plan is to discharge to memory care this date. Discussed with daughter about requesting for staff to assist patient more the first couple of days of her return to prevent potential fall due to hospital stay weakness. All further skilled PT needs can be addressed at next venue of care. Recommend HHPT at discharge to maximize functional independence and strengthening.        Recommendations for follow up therapy are one component of a multi-disciplinary discharge planning process, led by the attending physician.  Recommendations may be updated based on patient status, additional functional criteria and insurance authorization.  Follow Up Recommendations Home health PT      Assistance Recommended at Discharge Frequent or constant Supervision/Assistance  Patient can return home with the following       Equipment Recommendations None recommended by PT  Recommendations for Other Services       Functional Status Assessment Patient has had a recent decline in their functional status and demonstrates the ability to make significant improvements in function in a reasonable and predictable amount of time.      Precautions / Restrictions Precautions Precautions: Fall Restrictions Weight Bearing Restrictions: No      Mobility  Bed Mobility Overal bed mobility: Modified Independent                  Transfers Overall transfer level: Needs assistance Equipment used: Rolling Crystalann Korf (2 wheels) Transfers: Sit to/from Stand Sit to Stand: Min guard           General transfer comment: min guard for safety.    Ambulation/Gait Ambulation/Gait assistance: Min guard Gait Distance (Feet): 100 Feet Assistive device: Rolling Millissa Deese (2 wheels) Gait Pattern/deviations: Step-through pattern, Decreased stride length, Staggering right, Drifts right/left Gait velocity: decreased     General Gait Details: LOB x 2 but patient able to self correct. Min guard for safety. Cues for proper RW management  Stairs            Wheelchair Mobility    Modified Rankin (Stroke Patients Only)       Balance Overall balance assessment: Needs assistance Sitting-balance support: No upper extremity supported, Feet supported Sitting balance-Leahy Scale: Good     Standing balance support: Bilateral upper extremity supported, Reliant on assistive device for balance Standing balance-Leahy Scale: Poor Standing balance comment: reliant on UE support                             Pertinent Vitals/Pain Pain Assessment Pain Assessment: No/denies pain    Home Living Family/patient expects to be discharged to:: Other (Comment)                   Additional Comments: Memory  care unit at [facility]    Prior Function Prior Level of Function : Needs assist             Mobility Comments: ambulates with rollator ADLs Comments: staff assist with dressing     Hand Dominance        Extremity/Trunk Assessment   Upper Extremity Assessment Upper Extremity Assessment: Generalized weakness    Lower Extremity Assessment Lower Extremity Assessment: Generalized weakness     Cervical / Trunk Assessment Cervical / Trunk Assessment: Kyphotic  Communication   Communication: No difficulties  Cognition Arousal/Alertness: Awake/alert Behavior During Therapy: WFL for tasks assessed/performed Overall Cognitive Status: History of cognitive impairments - at baseline                                 General Comments: very pleasant throughout        General Comments General comments (skin integrity, edema, etc.): VSS on RA    Exercises     Assessment/Plan    PT Assessment All further PT needs can be met in the next venue of care  PT Problem List Decreased strength;Decreased activity tolerance;Decreased balance;Decreased mobility;Decreased cognition;Decreased safety awareness       PT Treatment Interventions      PT Goals (Current goals can be found in the Care Plan section)  Acute Rehab PT Goals Patient Stated Goal: to go back to facility PT Goal Formulation: With patient/family Time For Goal Achievement: 03/01/22 Potential to Achieve Goals: Good    Frequency       Co-evaluation               AM-PAC PT "6 Clicks" Mobility  Outcome Measure Help needed turning from your back to your side while in a flat bed without using bedrails?: None Help needed moving from lying on your back to sitting on the side of a flat bed without using bedrails?: None Help needed moving to and from a bed to a chair (including a wheelchair)?: A Little Help needed standing up from a chair using your arms (e.g., wheelchair or bedside chair)?: A Little Help needed to walk in hospital room?: A Little Help needed climbing 3-5 steps with a railing? : A Lot 6 Click Score: 19    End of Session Equipment Utilized During Treatment: Gait belt Activity Tolerance: Patient tolerated treatment well Patient left: in bed;with call bell/phone within reach;with family/visitor present Nurse Communication: Mobility status PT Visit Diagnosis: Unsteadiness on feet  (R26.81);Muscle weakness (generalized) (M62.81)    Time: 1314-1330 PT Time Calculation (min) (ACUTE ONLY): 16 min   Charges:   PT Evaluation $PT Eval Moderate Complexity: 1 Mod          Maleny Candy A. Gilford Rile PT, DPT Acute Rehabilitation Services Office [phone]   Linna Hoff 02/15/2022, 1:36 PM

## 2022-06-13 ENCOUNTER — Emergency Department (HOSPITAL_COMMUNITY)
Admission: EM | Admit: 2022-06-13 | Discharge: 2022-06-13 | Disposition: A | Discharge status: Home / Self Care | Payer: Medicare Other | Attending: Emergency Medicine | Admitting: Emergency Medicine

## 2022-06-13 ENCOUNTER — Emergency Department (HOSPITAL_COMMUNITY): Payer: Medicare Other

## 2022-06-13 DIAGNOSIS — I10 Essential (primary) hypertension: Secondary | ICD-10-CM | POA: Diagnosis not present

## 2022-06-13 DIAGNOSIS — F0390 Unspecified dementia without behavioral disturbance: Secondary | ICD-10-CM | POA: Insufficient documentation

## 2022-06-13 DIAGNOSIS — Z7982 Long term (current) use of aspirin: Secondary | ICD-10-CM | POA: Insufficient documentation

## 2022-06-13 DIAGNOSIS — R0602 Shortness of breath: Secondary | ICD-10-CM | POA: Diagnosis present

## 2022-06-13 DIAGNOSIS — Z7951 Long term (current) use of inhaled steroids: Secondary | ICD-10-CM | POA: Diagnosis not present

## 2022-06-13 DIAGNOSIS — R062 Wheezing: Secondary | ICD-10-CM | POA: Insufficient documentation

## 2022-06-13 DIAGNOSIS — E039 Hypothyroidism, unspecified: Secondary | ICD-10-CM | POA: Diagnosis not present

## 2022-06-13 DIAGNOSIS — Z20822 Contact with and (suspected) exposure to covid-19: Secondary | ICD-10-CM | POA: Diagnosis not present

## 2022-06-13 LAB — BASIC METABOLIC PANEL
Anion gap: 6 (ref 5–15)
BUN: 13 mg/dL (ref 8–23)
CO2: 21 mmol/L — ABNORMAL LOW (ref 22–32)
Calcium: 7.2 mg/dL — ABNORMAL LOW (ref 8.9–10.3)
Chloride: 117 mmol/L — ABNORMAL HIGH (ref 98–111)
Creatinine, Ser: 0.71 mg/dL (ref 0.44–1.00)
GFR, Estimated: >60 mL/min (ref >60)
Glucose, Bld: 97 mg/dL (ref 70–99)
Potassium: 3.2 mmol/L — ABNORMAL LOW (ref 3.5–5.1)
Sodium: 144 mmol/L (ref 135–145)

## 2022-06-13 LAB — CBC WITH DIFFERENTIAL/PLATELET
Abs Immature Granulocytes: 0.01 10*3/uL (ref 0.00–0.07)
Basophils Absolute: 0 10*3/uL (ref 0.0–0.1)
Basophils Relative: 1 %
Eosinophils Absolute: 0.2 10*3/uL (ref 0.0–0.5)
Eosinophils Relative: 3 %
HCT: 32.2 % — ABNORMAL LOW (ref 36.0–46.0)
Hemoglobin: 10.2 g/dL — ABNORMAL LOW (ref 12.0–15.0)
Immature Granulocytes: 0 %
Lymphocytes Relative: 31 %
Lymphs Abs: 1.7 10*3/uL (ref 0.7–4.0)
MCH: 29.1 pg (ref 26.0–34.0)
MCHC: 31.7 g/dL (ref 30.0–36.0)
MCV: 91.7 fL (ref 80.0–100.0)
Monocytes Absolute: 0.6 10*3/uL (ref 0.1–1.0)
Monocytes Relative: 11 %
Neutro Abs: 2.9 10*3/uL (ref 1.7–7.7)
Neutrophils Relative %: 54 %
Platelets: 173 10*3/uL (ref 150–400)
RBC: 3.51 MIL/uL — ABNORMAL LOW (ref 3.87–5.11)
RDW: 14.1 % (ref 11.5–15.5)
WBC: 5.4 10*3/uL (ref 4.0–10.5)
nRBC: 0 % (ref 0.0–0.2)

## 2022-06-13 LAB — RESP PANEL BY RT-PCR (FLU A&B, COVID) ARPGX2
Influenza A by PCR: NEGATIVE
Influenza B by PCR: NEGATIVE
SARS Coronavirus 2 by RT PCR: NEGATIVE

## 2022-06-13 LAB — TROPONIN I (HIGH SENSITIVITY)
Troponin I (High Sensitivity): 3 ng/L (ref <18)
Troponin I (High Sensitivity): 7 ng/L (ref <18)

## 2022-06-13 LAB — BRAIN NATRIURETIC PEPTIDE: B Natriuretic Peptide: 29.9 pg/mL (ref 0.0–100.0)

## 2022-06-13 MED ORDER — IPRATROPIUM-ALBUTEROL 0.5-2.5 (3) MG/3ML IN SOLN
3.0000 mL | Freq: Once | RESPIRATORY_TRACT | Status: AC
  Administered 2022-06-13: 3 mL via RESPIRATORY_TRACT
  Filled 2022-06-13: qty 3

## 2022-06-13 MED ORDER — METHYLPREDNISOLONE SODIUM SUCC 125 MG IJ SOLR
125.0000 mg | Freq: Once | INTRAMUSCULAR | Status: AC
  Administered 2022-06-13: 125 mg via INTRAVENOUS
  Filled 2022-06-13: qty 2

## 2022-06-13 NOTE — ED Notes (Signed)
[facility] in dept. To transport pt to [facility]. Pt sent via EMS with all belongings and paperwork.

## 2022-06-13 NOTE — ED Notes (Signed)
Attempted to call facility x 3, unsuccessful all 3 times.

## 2022-06-13 NOTE — Discharge Instructions (Signed)
Work-up was reassuring today-- normal CXR, negative covid/flu screen.   Follow-up with your primary care doctor. Return to the ED for new or worsening symptoms.

## 2022-06-13 NOTE — ED Triage Notes (Signed)
Per EMS patient is coming from [facility] facility with complaint of shortness of breath. Per EMS facility staff report that the patient has tried her neb treatment at home without resolution to her symptoms and patinet has BLE edema which is not normal for her. Patient has auditory wheezing on exhalation and inspiration on arrival. Per EMS patient has no hx. Of COPD, CHF ect.Marland Kitchen

## 2022-06-13 NOTE — ED Provider Notes (Signed)
[facility] EMERGENCY DEPARTMENT Provider Note   CSN: [id number] Arrival date & time: 06/13/22  0106     History  Chief Complaint  Patient presents with   Shortness of Breath    Rebecca Travis is a 88 y.o. female.  The history is provided by the patient and medical records.  Shortness of Breath Associated symptoms: wheezing    88 year old female with history of dementia, hypertension, hypothyroidism, GERD, arthritis, presenting to the ED with shortness of breath.  Per facility, began complaining of shortness of breath today.  She tried her home albuterol inhaler without much relief.  Patient also has new bilateral lower extremity edema.  She has no history of CHF.  No history of COPD or asthma.  She has not had any fever.  Unclear if any sick contacts at her facility.  Home Medications Prior to Admission medications   Medication Sig Start Date End Date Taking? Authorizing Provider  albuterol (VENTOLIN HFA) 108 (90 Base) MCG/ACT inhaler Inhale 2 puffs into the lungs every 6 (six) hours as needed for wheezing or shortness of breath. 02/15/22   Oswald Hillock, MD  amLODipine (NORVASC) 5 MG tablet Take 5 mg by mouth daily.    [provider]  aspirin 325 MG tablet Take 325 mg by mouth daily.    [provider]  FLORASTOR 250 MG capsule Take 250 mg by mouth daily.    [provider]  polyethylene glycol (MIRALAX / GLYCOLAX) 17 g packet Take 17 g by mouth daily as needed for mild constipation. 02/15/22   Oswald Hillock, MD  predniSONE (DELTASONE) 10 MG tablet Prednisone 40 mg po daily x 1 day then Prednisone 30 mg po daily x 1 day then Prednisone 20 mg po daily x 1 day then Prednisone 10 mg daily x 1 day then stop... 02/15/22   Oswald Hillock, MD  rivastigmine (EXELON) 13.3 MG/24HR Place 13.3 mg onto the skin daily.  02/07/17   [provider]  SYNTHROID 50 MCG tablet Take 50 mcg by mouth daily before breakfast. 01/30/15   [provider]  vitamin B-12 1000 MCG tablet Take 1 tablet (1,000 mcg total) by mouth daily. 10/01/19   Lucky Cowboy, MD      Allergies    Aricept Reather Littler hcl], Benzonatate, Cefuroxime, Donepezil, Fish oil, Gabapentin, Statins, Ezetimibe, Loratadine, and Tramadol    Review of Systems   Review of Systems  Respiratory:  Positive for shortness of breath and wheezing.   All other systems reviewed and are negative.   Physical Exam Updated Vital Signs BP (!) 150/81   Pulse 64   Temp 98 F (36.7 C)   Resp 16   SpO2 100%  Physical Exam Vitals and nursing note reviewed.  Constitutional:      Appearance: She is well-developed.     Comments: Elderly, very pleasant  HENT:     Head: Normocephalic and atraumatic.  Eyes:     Conjunctiva/sclera: Conjunctivae normal.     Pupils: Pupils are equal, round, and reactive to light.  Cardiovascular:     Rate and Rhythm: Normal rate and regular rhythm.     Heart sounds: Normal heart sounds.  Pulmonary:     Effort: Pulmonary effort is normal.     Breath sounds: Wheezing present.     Comments: Audible wheezing from bedside, sats 100%, no distress Abdominal:     General: Bowel sounds are normal.     Palpations: Abdomen is soft.  Musculoskeletal:        General: Normal range of motion.     Cervical back: Normal range of motion.     Comments: 1+ BLE from ankles up to mid-shin, pitting, grossly symmetric  Skin:    General: Skin is warm and dry.  Neurological:     Mental Status: She is alert and oriented to person, place, and time.     ED Results / Procedures / Treatments   Labs (all labs ordered are listed, but only abnormal results are displayed) Labs Reviewed  CBC WITH DIFFERENTIAL/PLATELET - Abnormal; Notable for the following components:      Result Value   RBC 3.51 (*)    Hemoglobin 10.2 (*)    HCT 32.2 (*)    All other components within normal limits  BASIC METABOLIC PANEL - Abnormal; Notable for the following components:    Potassium 3.2 (*)    Chloride 117 (*)    CO2 21 (*)    Calcium 7.2 (*)    All other components within normal limits  RESP PANEL BY RT-PCR (FLU A&B, COVID) ARPGX2  BRAIN NATRIURETIC PEPTIDE  TROPONIN I (HIGH SENSITIVITY)  TROPONIN I (HIGH SENSITIVITY)    EKG EKG Interpretation  Date/Time:  Thursday June 13 2022 04:24:21 EDT Ventricular Rate:  58 PR Interval:  244 QRS Duration: 87 QT Interval:  425 QTC Calculation: 418 R Axis:   50 Text Interpretation: Sinus rhythm Prolonged PR interval Low voltage, precordial leads Borderline T abnormalities, anterior leads Confirmed by Quintella Reichert [phone]) on 06/13/2022 4:27:06 AM  Radiology DG Chest Port 1 View  Result Date: 06/13/2022 CLINICAL DATA:  Shortness of breath and wheezing. EXAM: PORTABLE CHEST 1 VIEW COMPARISON:  Portable chest 02/13/2022 FINDINGS: There is mild cardiomegaly without vascular congestion or edema. The aorta is tortuous with patchy calcification. Stable mediastinum. The lungs are generally clear with old granulomatous disease in the right upper lobe again shown. The sulci are sharp. Moderate thoracic spondylosis and degenerative disc changes. IMPRESSION: No evidence of acute chest disease. Mild cardiomegaly and aortic atherosclerosis. Stable chest. Electronically Signed   By: Telford Nab M.D.   On: 06/13/2022 01:41    Procedures Procedures    Medications Ordered in ED Medications  ipratropium-albuterol (DUONEB) 0.5-2.5 (3) MG/3ML nebulizer solution 3 mL (3 mLs Nebulization Given 06/13/22 0132)  methylPREDNISolone sodium succinate (SOLU-MEDROL) 125 mg/2 mL injection 125 mg (125 mg Intravenous Given 06/13/22 0132)    ED Course/ Medical Decision Making/ A&P                           Medical Decision Making Amount and/or Complexity of Data Reviewed Labs: ordered. Radiology: ordered and independent interpretation performed. ECG/medicine tests: ordered and independent interpretation  performed.  Risk Prescription drug management.   88 y.o. F presenting to the ED with SOB.  Had some wheezing today, unrelieved by home inhaler.  She denies chest pain.  Does have some new LE edema, no hx of CHF.   She is afebrile ,non-toxic.  Does have some audible wheezing from bedside but maintaining normal saturations on RA.  1+ pitting edema BLE, grossly symmetric.  Will check EKG, labs, CXR, covid/flu screen.  Given solu-medrol and neb treatment.  EKG is non-ischemic.  Labs reassuring without leukocytosis or electrolyte derangement.  Trop negative x2, BNP WNL.  CXR is clear without acute findings. Covid/flu negative. It does appear that she has had LE edema on prior exams as well.  After neb treatment, patient reports she is feeling better.  She is no longer wheezing.  Vitals remain stable on RA.  Attempted to call family, however went straight to voicemail.  Patient appears stable for discharge back to facility.  Can return here for new concerns.  Shared visit with attending physician, Dr. Ralene Bathe, who evaluated patient and agrees with plan and care.  Final Clinical Impression(s) / ED Diagnoses Final diagnoses:  Shortness of breath  Wheezing    Rx / DC Orders ED Discharge Orders     None         Larene Pickett, PA-C 06/13/22 0543    Quintella Reichert, MD 06/15/22 [phone]

## 2022-07-02 NOTE — Progress Notes (Unsigned)
Synopsis: Referred for bronchial wall thickening by Rebecca Travis, *  Subjective:   PATIENT ID: Rebecca Travis GENDER: female DOB: March 05, 1925, MRN: [id number]  No chief complaint on file.  88yF with history of HTN, bronchitis, hypothyroid, SDH, referred for abnormal ct chest  Otherwise pertinent review of systems is negative.  Past Medical History:  Diagnosis Date   Arthritis    shoulder, knee  - otc med prn   Cancer (HCC)    GERD (gastroesophageal reflux disease)    Hyperlipidemia    diet controlled - no meds   Hypertension    Hypothyroidism    Subdural hematoma (HCC)    SVD (spontaneous vaginal delivery)    x 3     Family History  Problem Relation Age of Onset   Stroke Father    Dementia Neg Hx    Tremor Neg Hx      Past Surgical History:  Procedure Laterality Date   BREAST SURGERY     right breast duct    COLONOSCOPY     CRANIOTOMY Left 03/25/2015   Procedure: CRANIOTOMY HEMATOMA EVACUATION SUBDURAL;  Surgeon: Consuella Lose, MD;  Location: [facility];  Service: Neurosurgery;  Laterality: Left;   HYSTEROSCOPY WITH D & C N/A 09/15/2013   Procedure: DILATATION AND CURETTAGE /HYSTEROSCOPY WITH UTERINE POLYP RESECTION;  Surgeon: Eldred Manges, MD;  Location: [facility];  Service: Gynecology;  Laterality: N/A;   TOOTH EXTRACTION     TOTAL THYROIDECTOMY      Social History   Socioeconomic History   Marital status: Divorced    Spouse name: Not on file   Number of children: 3   Years of education: Not on file   Highest education level: Not on file  Occupational History   Occupation: Retired  Tobacco Use   Smoking status: Former    Packs/day: 0.15    Types: Cigarettes    Quit date: 08/13/1951    Years since quitting: 70.9   Smokeless tobacco: Never  Substance and Sexual Activity   Alcohol use: Yes    Alcohol/week: 0.0 standard drinks of alcohol    Comment: 1 glass wine per week   Drug use: No   Sexual activity: Not Currently  Other Topics  Concern   Not on file  Social History Narrative   Lives In assisted living at [facility]    Caffeine use: 1 cup coffee in the  Morning    Right handed   Social Determinants of Health   Financial Resource Strain: Not on file  Food Insecurity: Not on file  Transportation Needs: Not on file  Physical Activity: Not on file  Stress: Not on file  Social Connections: Not on file  Intimate Partner Violence: Not on file     Allergies  Allergen Reactions   Aricept [Donepezil Hcl] Other (See Comments)    nightmares   Benzonatate Diarrhea   Cefuroxime Diarrhea   Donepezil Other (See Comments)    NIGHTMARES   Fish Oil Diarrhea and Other (See Comments)    EXCESSIVE DIARRHEA   Gabapentin Other (See Comments)    DROWSINESS   Statins Other (See Comments)    MYALGIAS   Ezetimibe Other (See Comments)   Loratadine Other (See Comments)   Tramadol Other (See Comments)     Outpatient Medications Prior to Visit  Medication Sig Dispense Refill   albuterol (VENTOLIN HFA) 108 (90 Base) MCG/ACT inhaler Inhale 2 puffs into the lungs every 6 (six) hours as needed for wheezing  or shortness of breath. 8 g 0   amLODipine (NORVASC) 5 MG tablet Take 5 mg by mouth daily.     aspirin 325 MG tablet Take 325 mg by mouth daily.     FLORASTOR 250 MG capsule Take 250 mg by mouth daily.     polyethylene glycol (MIRALAX / GLYCOLAX) 17 g packet Take 17 g by mouth daily as needed for mild constipation. 14 each 0   predniSONE (DELTASONE) 10 MG tablet Prednisone 40 mg po daily x 1 day then Prednisone 30 mg po daily x 1 day then Prednisone 20 mg po daily x 1 day then Prednisone 10 mg daily x 1 day then stop... 10 tablet 0   rivastigmine (EXELON) 13.3 MG/24HR Place 13.3 mg onto the skin daily.      SYNTHROID 50 MCG tablet Take 50 mcg by mouth daily before breakfast.     vitamin B-12 1000 MCG tablet Take 1 tablet (1,000 mcg total) by mouth daily. 30 tablet 1   No facility-administered medications prior to visit.        Objective:   Physical Exam:  General appearance: 88 y.o., female, NAD, conversant  Eyes: anicteric sclerae; PERRL, tracking appropriately HENT: NCAT; MMM Neck: Trachea midline; no lymphadenopathy, no JVD Lungs: CTAB, no crackles, no wheeze, with normal respiratory effort CV: RRR, no murmur  Abdomen: Soft, non-tender; non-distended, BS present  Extremities: No peripheral edema, warm Skin: Normal turgor and texture; no rash Psych: Appropriate affect Neuro: Alert and oriented to person and place, no focal deficit     There were no vitals filed for this visit.   on *** LPM *** RA BMI Readings from Last 3 Encounters:  02/14/22 29.70 kg/m  01/05/20 27.98 kg/m  09/27/19 24.38 kg/m   Wt Readings from Last 3 Encounters:  02/14/22 173 lb (78.5 kg)  01/05/20 163 lb (73.9 kg)  09/27/19 158 lb (71.7 kg)     CBC    Component Value Date/Time   WBC 5.4 06/13/2022 0116   RBC 3.51 (L) 06/13/2022 0116   HGB 10.2 (L) 06/13/2022 0116   HGB 13.2 08/17/2015 1518   HCT 32.2 (L) 06/13/2022 0116   HCT 40.5 08/17/2015 1518   PLT 173 06/13/2022 0116   PLT 248 08/17/2015 1518   MCV 91.7 06/13/2022 0116   MCV 89 08/17/2015 1518   MCH 29.1 06/13/2022 0116   MCHC 31.7 06/13/2022 0116   RDW 14.1 06/13/2022 0116   RDW 14.5 08/17/2015 1518   LYMPHSABS 1.7 06/13/2022 0116   LYMPHSABS 1.8 08/17/2015 1518   MONOABS 0.6 06/13/2022 0116   EOSABS 0.2 06/13/2022 0116   EOSABS 0.0 08/17/2015 1518   BASOSABS 0.0 06/13/2022 0116   BASOSABS 0.0 08/17/2015 1518    Eos 200-300  Chest Imaging: CTA Chest 02/13/22 reviewed by me with bronchial wall thickening, mucoid impaction, subsegmental atelectasis  Pulmonary Functions Testing Results:     No data to display            Echocardiogram 2017:   - Left ventricle: The cavity size was normal. Wall thickness was    increased in a pattern of mild LVH. Systolic function was    vigorous. The estimated ejection fraction was in the  range of 65%    to 70%. Doppler parameters are consistent with abnormal left    ventricular relaxation (grade 1 diastolic dysfunction).  - Mitral valve: There was mild regurgitation.  - Pulmonary arteries: PA peak pressure: 33 mm Hg (S).   Heart Catheterization: ***  Assessment & Plan:    Plan:      Rebecca Travis Hurter, MD [facility] 07/02/2022 11:26 AM

## 2022-07-03 ENCOUNTER — Ambulatory Visit (INDEPENDENT_AMBULATORY_CARE_PROVIDER_SITE_OTHER): Payer: Medicare Other | Admitting: Student

## 2022-07-03 ENCOUNTER — Encounter: Payer: Self-pay | Admitting: Student

## 2022-07-03 VITALS — BP 114/68 | HR 60 | Temp 97.8°F | Ht 64.0 in | Wt 177.0 lb

## 2022-07-03 DIAGNOSIS — R0600 Dyspnea, unspecified: Secondary | ICD-10-CM

## 2022-07-03 MED ORDER — BREZTRI AEROSPHERE 160-9-4.8 MCG/ACT IN AERO: 2.0000 | INHALATION_SPRAY | Freq: Two times a day (BID) | RESPIRATORY_TRACT | 11 refills | Status: AC

## 2022-07-03 NOTE — Patient Instructions (Addendum)
-   STOP symbicort - START breztri 2 puffs twice daily. Take evening dose of breztri before dinner. Brush teeth and tongue after each use. - Albuterol as needed - see you in 3 months or sooner if need be!

## 2022-11-12 NOTE — Progress Notes (Unsigned)
Synopsis: Referred for bronchial wall thickening by Lajean Manes, MD  Subjective:   PATIENT ID: Rebecca Travis GENDER: female DOB: 1925-07-20, MRN: [id number]  No chief complaint on file.  89yF with history of HTN, bronchitis, hypothyroid, SDH, referred for abnormal ct chest  Her daughter says she is taking budesonide-formoterol once daily at her facility - [facility]. She doesn't feel dyspneic right now. Per daughter however she was when she was being picked up by daughter. Some wheezing as well. No cough. No sinus congestion/PND. Never had asthma growing up. Reports no trouble swallowing food or liquid.  Son has asthma, no other lung disease  [email or web address] - Health and Doctor, general practice. [place].   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 She has lived in [place] for most of her life. Moved to [place] in 60s, then to [place]. Smoked limited amount in her 31s. No MJ. Had dogs.   Interval HPI: Switched to breztri from symbicort last visit  Otherwise pertinent review of systems is negative.  Past Medical History:  Diagnosis Date   Arthritis    shoulder, knee  - otc med prn   Cancer (HCC)    GERD (gastroesophageal reflux disease)    Hyperlipidemia    diet controlled - no meds   Hypertension    Hypothyroidism    Subdural hematoma (HCC)    SVD (spontaneous vaginal delivery)    x 3     Family History  Problem Relation Age of Onset   Stroke Father    Dementia Neg Hx    Tremor Neg Hx      Past Surgical History:  Procedure Laterality Date   BREAST SURGERY     right breast duct    COLONOSCOPY     CRANIOTOMY Left 03/25/2015   Procedure: CRANIOTOMY HEMATOMA EVACUATION SUBDURAL;  Surgeon: Consuella Lose, MD;  Location: [facility];  Service: Neurosurgery;  Laterality: Left;   HYSTEROSCOPY WITH D & C N/A 09/15/2013   Procedure: DILATATION AND CURETTAGE /HYSTEROSCOPY WITH UTERINE POLYP RESECTION;  Surgeon: Eldred Manges, MD;  Location: [facility];   Service: Gynecology;  Laterality: N/A;   TOOTH EXTRACTION     TOTAL THYROIDECTOMY      Social History   Socioeconomic History   Marital status: Divorced    Spouse name: Not on file   Number of children: 3   Years of education: Not on file   Highest education level: Not on file  Occupational History   Occupation: Retired  Tobacco Use   Smoking status: Former    Packs/day: 0.15    Years: 2.00    Additional pack years: 0.00    Total pack years: 0.30    Types: Cigarettes    Quit date: 08/13/1951    Years since quitting: 71.2   Smokeless tobacco: Never  Vaping Use   Vaping Use: Never used  Substance and Sexual Activity   Alcohol use: Yes    Alcohol/week: 0.0 standard drinks of alcohol    Comment: 1 glass wine per week   Drug use: No   Sexual activity: Not Currently  Other Topics Concern   Not on file  Social History Narrative   Lives In assisted living at [facility]    Caffeine use: 1 cup coffee in the  Morning    Right handed   Social Determinants of Health   Financial Resource Strain: Not on file  Food Insecurity: Not on file  Transportation Needs: Not on file  Physical Activity: Not on file  Stress: Not on file  Social Connections: Not on file  Intimate Partner Violence: Not on file     Allergies  Allergen Reactions   Aricept [Donepezil Hcl] Other (See Comments)    nightmares   Benzonatate Diarrhea   Cefuroxime Diarrhea   Donepezil Other (See Comments)    NIGHTMARES   Fish Oil Diarrhea and Other (See Comments)    EXCESSIVE DIARRHEA   Gabapentin Other (See Comments)    DROWSINESS   Statins Other (See Comments)    MYALGIAS   Ezetimibe Other (See Comments)   Loratadine Other (See Comments)   Tramadol Other (See Comments)     Outpatient Medications Prior to Visit  Medication Sig Dispense Refill   albuterol (VENTOLIN HFA) 108 (90 Base) MCG/ACT inhaler Inhale 2 puffs into the lungs every 6 (six) hours as needed for wheezing or shortness of breath. 8  g 0   amLODipine (NORVASC) 5 MG tablet Take 5 mg by mouth daily.     aspirin 325 MG tablet Take 325 mg by mouth daily.     Budeson-Glycopyrrol-Formoterol (BREZTRI AEROSPHERE) 160-9-4.8 MCG/ACT AERO Inhale 2 puffs into the lungs in the morning and at bedtime. Take evening dose of breztri before dinner. Brush teeth and tongue after each use 1 each 11   FLORASTOR 250 MG capsule Take 250 mg by mouth daily.     polyethylene glycol (MIRALAX / GLYCOLAX) 17 g packet Take 17 g by mouth daily as needed for mild constipation. 14 each 0   rivastigmine (EXELON) 13.3 MG/24HR Place 13.3 mg onto the skin daily.      SYNTHROID 50 MCG tablet Take 50 mcg by mouth daily before breakfast.     vitamin B-12 1000 MCG tablet Take 1 tablet (1,000 mcg total) by mouth daily. 30 tablet 1   No facility-administered medications prior to visit.       Objective:   Physical Exam:  General appearance: 89 y.o., female, NAD, conversant, female, NAD, conversant  Eyes: anicteric sclerae; PERRL, tracking appropriately HENT: NCAT; MMM Neck: Trachea midline; no lymphadenopathy, no JVD Lungs: CTAB, no crackles, no wheeze, with normal respiratory effort CV: RRR, no murmur  Abdomen: Soft, non-tender; non-distended, BS present  Extremities: No peripheral edema, warm Skin: Normal turgor and texture; no rash Psych: Appropriate affect Neuro: Alert and oriented to person and place, no focal deficit     There were no vitals filed for this visit.    on RA BMI Readings from Last 3 Encounters:  07/03/22 30.38 kg/m  02/14/22 29.70 kg/m  01/05/20 27.98 kg/m   Wt Readings from Last 3 Encounters:  07/03/22 177 lb (80.3 kg)  02/14/22 173 lb (78.5 kg)  01/05/20 163 lb (73.9 kg)     CBC    Component Value Date/Time   WBC 5.4 06/13/2022 0116   RBC 3.51 (L) 06/13/2022 0116   HGB 10.2 (L) 06/13/2022 0116   HGB 13.2 08/17/2015 1518   HCT 32.2 (L) 06/13/2022 0116   HCT 40.5 08/17/2015 1518   PLT 173 06/13/2022 0116   PLT 248 08/17/2015 1518    MCV 91.7 06/13/2022 0116   MCV 89 08/17/2015 1518   MCH 29.1 06/13/2022 0116   MCHC 31.7 06/13/2022 0116   RDW 14.1 06/13/2022 0116   RDW 14.5 08/17/2015 1518   LYMPHSABS 1.7 06/13/2022 0116   LYMPHSABS 1.8 08/17/2015 1518   MONOABS 0.6 06/13/2022 0116   EOSABS 0.2 06/13/2022 0116   EOSABS 0.0 08/17/2015 1518   BASOSABS 0.0  06/13/2022 0116   BASOSABS 0.0 08/17/2015 1518    Eos 200-300  Chest Imaging: CTA Chest 02/13/22 reviewed by me with bronchial wall thickening, mucoid impaction, subsegmental atelectasis  Pulmonary Functions Testing Results:     No data to display            Echocardiogram 2017:   - Left ventricle: The cavity size was normal. Wall thickness was    increased in a pattern of mild LVH. Systolic function was    vigorous. The estimated ejection fraction was in the range of 65%    to 70%. Doppler parameters are consistent with abnormal left    ventricular relaxation (grade 1 diastolic dysfunction).  - Mitral valve: There was mild regurgitation.  - Pulmonary arteries: PA peak pressure: 33 mm Hg (S).    Assessment & Plan:   # Possible moderate persistent asthma  # Eosinophilic asthma # Marked bronchial wall thickening, limited extent of lower lobe predominant mucoid impaction/subsegmental atelectasis  Plan: - STOP symbicort - START breztri 2 puffs twice daily. Take evening dose of breztri before dinner. Brush teeth and tongue after each use. - Albuterol as needed - see you in 3 months or sooner if need be!     [name] Hurter, MD [facility] 11/12/2022 8:38 AM

## 2022-11-13 ENCOUNTER — Ambulatory Visit: Payer: Medicare Other | Admitting: Student

## 2022-11-13 ENCOUNTER — Encounter: Payer: Self-pay | Admitting: Student

## 2022-11-13 VITALS — BP 130/82 | HR 68 | Temp 97.8°F | Ht 64.0 in | Wt 174.0 lb

## 2022-11-13 DIAGNOSIS — J4540 Moderate persistent asthma, uncomplicated: Secondary | ICD-10-CM

## 2022-11-13 NOTE — Patient Instructions (Signed)
-   Breztri 2 puffs twice daily. Take evening dose of breztri before dinner. Brush teeth and tongue after each use. Take with spacer.  - Albuterol as needed, can also use with spacer - see you in 6 months or sooner if need be!

## 2024-04-12 ENCOUNTER — Encounter (HOSPITAL_COMMUNITY): Payer: Self-pay

## 2024-04-12 ENCOUNTER — Emergency Department (HOSPITAL_COMMUNITY): Admission: EM | Admit: 2024-04-12 | Discharge: 2024-04-12 | Disposition: A | Discharge status: Home / Self Care

## 2024-04-12 DIAGNOSIS — Z7982 Long term (current) use of aspirin: Secondary | ICD-10-CM | POA: Insufficient documentation

## 2024-04-12 DIAGNOSIS — R29810 Facial weakness: Secondary | ICD-10-CM | POA: Diagnosis present

## 2024-04-12 DIAGNOSIS — G510 Bell's palsy: Secondary | ICD-10-CM | POA: Diagnosis not present

## 2024-04-12 MED ORDER — IPRATROPIUM-ALBUTEROL 0.5-2.5 (3) MG/3ML IN SOLN: 3.0000 mL | Freq: Once | RESPIRATORY_TRACT | Status: DC

## 2024-04-12 MED ORDER — VALACYCLOVIR HCL 500 MG PO TABS
1000.0000 mg | ORAL_TABLET | Freq: Once | ORAL | Status: DC
  Filled 2024-04-12: qty 2

## 2024-04-12 MED ORDER — PREDNISONE 10 MG PO TABS: 60.0000 mg | ORAL_TABLET | Freq: Every day | ORAL | 0 refills | Status: AC

## 2024-04-12 MED ORDER — VALACYCLOVIR HCL 1 G PO TABS: 1000.0000 mg | ORAL_TABLET | Freq: Three times a day (TID) | ORAL | 0 refills | Status: AC

## 2024-04-12 MED ORDER — PREDNISONE 20 MG PO TABS: 60.0000 mg | ORAL_TABLET | Freq: Once | ORAL | Status: DC

## 2024-04-12 NOTE — ED Provider Notes (Signed)
 [place] EMERGENCY DEPARTMENT AT [facility] Provider Note   CSN: [id number] Arrival date & time: 04/12/24  1132     Patient presents with: Facial Droop   Rebecca Travis is a 89 y.o. female.   89 year old female sent in for evaluation of left facial droop.  Staff did not know when this started but LPN this morning noticed it today.  She is being treated for eye irritation at the facility currently.  Patient denies any other symptoms or concerns.        Prior to Admission medications   Medication Sig Start Date End Date Taking? Authorizing Provider  predniSONE  (DELTASONE ) 10 MG tablet Take 6 tablets (60 mg total) by mouth daily for 7 days. 04/12/24 04/19/24 Yes Danyal Whitenack L, DO  valACYclovir  (VALTREX ) 1000 MG tablet Take 1 tablet (1,000 mg total) by mouth 3 (three) times daily for 14 days. 04/12/24 04/26/24 Yes Haji Delaine L, DO  albuterol  (VENTOLIN  HFA) 108 (90 Base) MCG/ACT inhaler Inhale 2 puffs into the lungs every 6 (six) hours as needed for wheezing or shortness of breath. 02/15/22   Drusilla Sabas RAMAN, MD  amLODipine  (NORVASC ) 5 MG tablet Take 5 mg by mouth daily.    [provider]  aspirin  325 MG tablet Take 325 mg by mouth daily.    [provider]  Budeson-Glycopyrrol-Formoterol (BREZTRI  AEROSPHERE) 160-9-4.8 MCG/ACT AERO Inhale 2 puffs into the lungs in the morning and at bedtime. Take evening dose of breztri  before dinner. Brush teeth and tongue after each use 07/03/22   Gladis Leonor HERO, MD  FLORASTOR 250 MG capsule Take 250 mg by mouth daily.    [provider]  polyethylene glycol (MIRALAX  / GLYCOLAX ) 17 g packet Take 17 g by mouth daily as needed for mild constipation. 02/15/22   Drusilla Sabas RAMAN, MD  rivastigmine  (EXELON ) 13.3 MG/24HR Place 13.3 mg onto the skin daily.  02/07/17   [provider]  SYNTHROID  50 MCG tablet Take 50 mcg by mouth daily before breakfast. 01/30/15   [provider]  vitamin B-12 1000 MCG  tablet Take 1 tablet (1,000 mcg total) by mouth daily. 10/01/19   Tobie Otha GRADE, MD    Allergies: Aricept [donepezil hcl], Benzonatate, Cefuroxime, Donepezil, Fish oil, Gabapentin, Statins, Ezetimibe, Loratadine, and Tramadol    Review of Systems  Constitutional:  Negative for chills and fever.  HENT:  Negative for ear pain and sore throat.   Eyes:  Negative for pain and visual disturbance.  Respiratory:  Negative for cough and shortness of breath.   Cardiovascular:  Negative for chest pain and palpitations.  Gastrointestinal:  Negative for abdominal pain and vomiting.  Genitourinary:  Negative for dysuria and hematuria.  Musculoskeletal:  Negative for arthralgias and back pain.  Skin:  Negative for color change and rash.  Neurological:  Negative for seizures and syncope.  All other systems reviewed and are negative.   Updated Vital Signs BP (!) 151/83   Pulse 64   Temp 98.2 F (36.8 C) (Oral)   Resp 17   SpO2 100%   Physical Exam Vitals and nursing note reviewed.  Constitutional:      General: She is not in acute distress.    Appearance: Normal appearance. She is well-developed. She is not ill-appearing.  HENT:     Head: Normocephalic and atraumatic.  Eyes:     Comments: Left eye is red and irritated with patient denies any pain, there is some clear tearing.  Cardiovascular:  Rate and Rhythm: Normal rate and regular rhythm.     Heart sounds: No murmur heard. Pulmonary:     Effort: Pulmonary effort is normal. No respiratory distress.     Breath sounds: Normal breath sounds.  Abdominal:     Palpations: Abdomen is soft.     Tenderness: There is no abdominal tenderness.  Musculoskeletal:        General: No swelling.     Cervical back: Neck supple.  Skin:    General: Skin is warm and dry.     Capillary Refill: Capillary refill takes less than 2 seconds.  Neurological:     Mental Status: She is alert.     Comments: There is left eyelid drooping and evidence of  Bell's palsy on the face with facial droop on the left and the lack of forehead wrinkles, there are no other focal deficits and patient strength is equal bilaterally.  Psychiatric:        Mood and Affect: Mood normal.     (all labs ordered are listed, but only abnormal results are displayed) Labs Reviewed - No data to display  EKG: EKG Interpretation Date/Time:  Monday April 12 2024 11:40:29 EDT Ventricular Rate:  59 PR Interval:  53 QRS Duration:  113 QT Interval:  426 QTC Calculation: 419 R Axis:   -6  Text Interpretation: Sinus rhythm Short PR interval Borderline intraventricular conduction delay Borderline T abnormalities, anterior leads Compared with prior EKG from  06/13/2022 Confirmed by Gennaro Bouchard ([id number]) on 04/12/2024 11:44:50 AM  Radiology: No results found.   Procedures   Medications Ordered in the ED  valACYclovir  (VALTREX ) tablet 1,000 mg (has no administration in time range)  predniSONE  (DELTASONE ) tablet 60 mg (has no administration in time range)                                    Medical Decision Making Social determinants of health: Patient lives in a nursing home, history of dementia  Patient here from nursing home with facial droop.  Does have evidence of clear Bell's palsy and no stroke etiology suspected.  Recommended artificial tears and eye patching at night and I will start her on valacyclovir  and steroids.  Given her first dose here.  Advise close follow-up with her primary care doctor and otherwise return to the ER for new or worsening symptoms.  She will be discharged back to the nursing home.  Problems Addressed: Bell's palsy: acute illness or injury  Amount and/or Complexity of Data Reviewed External Data Reviewed: notes.    Details: Prior outpatient records reviewed and patient seen in outpatient office 12/17/2023 for shortness of breath.  Risk OTC drugs. Prescription drug management. Diagnosis or treatment significantly limited by  social determinants of health.    Final diagnoses:  Bell's palsy    ED Discharge Orders          Ordered    predniSONE  (DELTASONE ) 10 MG tablet  Daily        04/12/24 1153    valACYclovir  (VALTREX ) 1000 MG tablet  3 times daily        04/12/24 1153               Hayven Croy, [place] L, DO 04/12/24 1159

## 2024-04-12 NOTE — ED Notes (Signed)
 PT verbalized understanding of DC instructions.Pt amb out of ED with all belongings

## 2024-04-12 NOTE — ED Notes (Signed)
PTAR at bedside to transport pt home 

## 2024-04-12 NOTE — ED Triage Notes (Signed)
 Patient BIB [facility] from [facility] after staff noticed L facial droop at 0900 at breakfast with some L eye redness. Staff from previous shift did not mention it being present, unable to confirm last known well. Patient has dementia but no other neuro deficits at this time. Facility reports patient is able to use walker sometimes. A&Ox2 and baseline. BP 140/78 HR 67 98% RA CBG 217 R 18

## 2024-04-12 NOTE — Discharge Instructions (Signed)
 Patient has Bell's palsy and should take her valacyclovir  and steroids as prescribed.  Is important that she follow-up with primary care to ensure resolution of her symptoms and to keep an eye on her left eye as it appears irritated.  Would recommend artificial tears and eye patches at nighttime to help avoid corneal abrasion.

## 2024-04-12 NOTE — ED Triage Notes (Signed)
 Facility staff reports to EMS that patient ate breakfast this morning with no trouble swallowing, coughing, or choking.
